# Patient Record
Sex: Female | Born: 1935
Health system: Southern US, Community
[De-identification: ages and names within clinical notes are randomized; demographics above are authoritative.]

## PROBLEM LIST (undated history)

## (undated) DIAGNOSIS — C801 Malignant (primary) neoplasm, unspecified: Secondary | ICD-10-CM

## (undated) DIAGNOSIS — Z9221 Personal history of antineoplastic chemotherapy: Secondary | ICD-10-CM

## (undated) DIAGNOSIS — I10 Essential (primary) hypertension: Secondary | ICD-10-CM

## (undated) DIAGNOSIS — Z86718 Personal history of other venous thrombosis and embolism: Secondary | ICD-10-CM

## (undated) HISTORY — PX: ABDOMINAL HYSTERECTOMY: SHX81

## (undated) HISTORY — PX: BREAST SURGERY: SHX581

## (undated) HISTORY — PX: EYE SURGERY: SHX253

## (undated) HISTORY — DX: Essential (primary) hypertension: I10

## (undated) HISTORY — DX: Malignant (primary) neoplasm, unspecified: C80.1

## (undated) HISTORY — PX: ROTATOR CUFF REPAIR: SHX139

## (undated) HISTORY — DX: Personal history of other venous thrombosis and embolism: Z86.718

## (undated) HISTORY — PX: OTHER SURGICAL HISTORY: SHX169

---

## 1997-01-16 DIAGNOSIS — Z9221 Personal history of antineoplastic chemotherapy: Secondary | ICD-10-CM

## 1997-01-16 HISTORY — DX: Personal history of antineoplastic chemotherapy: Z92.21

## 1997-01-16 HISTORY — PX: MASTECTOMY: SHX3

## 1997-05-08 HISTORY — PX: BREAST EXCISIONAL BIOPSY: SUR124

## 1997-12-22 ENCOUNTER — Other Ambulatory Visit: Admission: RE | Admit: 1997-12-22 | Discharge: 1997-12-22 | Payer: Self-pay | Admitting: Internal Medicine

## 1998-09-22 ENCOUNTER — Inpatient Hospital Stay (HOSPITAL_COMMUNITY): Admission: RE | Admit: 1998-09-22 | Discharge: 1998-09-29 | Payer: Self-pay | Admitting: Internal Medicine

## 1998-09-22 ENCOUNTER — Encounter: Payer: Self-pay | Admitting: Internal Medicine

## 1998-11-01 ENCOUNTER — Ambulatory Visit (HOSPITAL_COMMUNITY): Admission: RE | Admit: 1998-11-01 | Discharge: 1998-11-01 | Payer: Self-pay | Admitting: Oncology

## 1998-11-01 ENCOUNTER — Encounter (HOSPITAL_COMMUNITY): Payer: Self-pay | Admitting: Oncology

## 1998-11-08 ENCOUNTER — Encounter (HOSPITAL_COMMUNITY): Payer: Self-pay | Admitting: Oncology

## 1998-11-08 ENCOUNTER — Ambulatory Visit (HOSPITAL_COMMUNITY): Admission: RE | Admit: 1998-11-08 | Discharge: 1998-11-08 | Payer: Self-pay | Admitting: Oncology

## 1998-11-11 ENCOUNTER — Ambulatory Visit (HOSPITAL_COMMUNITY): Admission: RE | Admit: 1998-11-11 | Discharge: 1998-11-11 | Payer: Self-pay | Admitting: Oncology

## 1998-11-11 ENCOUNTER — Encounter (HOSPITAL_COMMUNITY): Payer: Self-pay | Admitting: Oncology

## 1998-12-01 ENCOUNTER — Encounter: Payer: Self-pay | Admitting: Thoracic Surgery

## 1998-12-02 ENCOUNTER — Encounter (INDEPENDENT_AMBULATORY_CARE_PROVIDER_SITE_OTHER): Payer: Self-pay | Admitting: Specialist

## 1998-12-02 ENCOUNTER — Encounter: Payer: Self-pay | Admitting: Thoracic Surgery

## 1998-12-02 ENCOUNTER — Inpatient Hospital Stay: Admission: RE | Admit: 1998-12-02 | Discharge: 1998-12-06 | Payer: Self-pay | Admitting: Thoracic Surgery

## 1998-12-03 ENCOUNTER — Encounter: Payer: Self-pay | Admitting: Thoracic Surgery

## 1998-12-04 ENCOUNTER — Encounter: Payer: Self-pay | Admitting: Thoracic Surgery

## 1998-12-05 ENCOUNTER — Encounter: Payer: Self-pay | Admitting: Thoracic Surgery

## 1998-12-06 ENCOUNTER — Encounter: Payer: Self-pay | Admitting: Thoracic Surgery

## 1998-12-15 ENCOUNTER — Encounter: Admission: RE | Admit: 1998-12-15 | Discharge: 1998-12-15 | Payer: Self-pay | Admitting: Thoracic Surgery

## 1998-12-15 ENCOUNTER — Encounter: Payer: Self-pay | Admitting: Thoracic Surgery

## 1999-01-05 ENCOUNTER — Encounter: Payer: Self-pay | Admitting: Thoracic Surgery

## 1999-01-05 ENCOUNTER — Encounter: Admission: RE | Admit: 1999-01-05 | Discharge: 1999-01-05 | Payer: Self-pay | Admitting: Thoracic Surgery

## 1999-02-04 ENCOUNTER — Encounter: Admission: RE | Admit: 1999-02-04 | Discharge: 1999-02-04 | Payer: Self-pay | Admitting: Internal Medicine

## 1999-02-04 ENCOUNTER — Encounter: Payer: Self-pay | Admitting: Internal Medicine

## 2000-01-17 HISTORY — PX: BREAST EXCISIONAL BIOPSY: SUR124

## 2000-02-07 ENCOUNTER — Encounter: Admission: RE | Admit: 2000-02-07 | Discharge: 2000-02-07 | Payer: Self-pay | Admitting: Internal Medicine

## 2000-02-07 ENCOUNTER — Encounter: Payer: Self-pay | Admitting: Internal Medicine

## 2000-05-28 ENCOUNTER — Ambulatory Visit (HOSPITAL_BASED_OUTPATIENT_CLINIC_OR_DEPARTMENT_OTHER): Admission: RE | Admit: 2000-05-28 | Discharge: 2000-05-28 | Payer: Self-pay | Admitting: General Surgery

## 2000-05-28 ENCOUNTER — Encounter (INDEPENDENT_AMBULATORY_CARE_PROVIDER_SITE_OTHER): Payer: Self-pay | Admitting: Specialist

## 2001-02-14 ENCOUNTER — Encounter: Payer: Self-pay | Admitting: Internal Medicine

## 2001-02-14 ENCOUNTER — Encounter: Admission: RE | Admit: 2001-02-14 | Discharge: 2001-02-14 | Payer: Self-pay | Admitting: Internal Medicine

## 2001-03-25 ENCOUNTER — Encounter (HOSPITAL_COMMUNITY): Payer: Self-pay | Admitting: Oncology

## 2001-03-25 ENCOUNTER — Ambulatory Visit (HOSPITAL_COMMUNITY): Admission: RE | Admit: 2001-03-25 | Discharge: 2001-03-25 | Payer: Self-pay | Admitting: Oncology

## 2002-02-17 ENCOUNTER — Encounter: Admission: RE | Admit: 2002-02-17 | Discharge: 2002-02-17 | Payer: Self-pay | Admitting: General Surgery

## 2002-02-17 ENCOUNTER — Encounter: Payer: Self-pay | Admitting: General Surgery

## 2002-03-25 ENCOUNTER — Ambulatory Visit (HOSPITAL_COMMUNITY): Admission: RE | Admit: 2002-03-25 | Discharge: 2002-03-25 | Payer: Self-pay | Admitting: Oncology

## 2002-03-25 ENCOUNTER — Encounter (HOSPITAL_COMMUNITY): Payer: Self-pay | Admitting: Oncology

## 2003-02-24 ENCOUNTER — Ambulatory Visit (HOSPITAL_COMMUNITY): Admission: RE | Admit: 2003-02-24 | Discharge: 2003-02-24 | Payer: Self-pay | Admitting: Internal Medicine

## 2004-02-16 ENCOUNTER — Ambulatory Visit (HOSPITAL_COMMUNITY): Admission: RE | Admit: 2004-02-16 | Discharge: 2004-02-16 | Payer: Self-pay | Admitting: Internal Medicine

## 2004-02-25 ENCOUNTER — Ambulatory Visit (HOSPITAL_COMMUNITY): Admission: RE | Admit: 2004-02-25 | Discharge: 2004-02-25 | Payer: Self-pay | Admitting: Internal Medicine

## 2004-05-02 ENCOUNTER — Ambulatory Visit (HOSPITAL_COMMUNITY): Admission: RE | Admit: 2004-05-02 | Discharge: 2004-05-02 | Payer: Self-pay | Admitting: *Deleted

## 2004-05-02 ENCOUNTER — Encounter (INDEPENDENT_AMBULATORY_CARE_PROVIDER_SITE_OTHER): Payer: Self-pay | Admitting: *Deleted

## 2005-04-06 ENCOUNTER — Ambulatory Visit (HOSPITAL_COMMUNITY): Admission: RE | Admit: 2005-04-06 | Discharge: 2005-04-06 | Payer: Self-pay | Admitting: Internal Medicine

## 2005-06-13 ENCOUNTER — Ambulatory Visit: Payer: Self-pay | Admitting: Internal Medicine

## 2005-06-16 ENCOUNTER — Ambulatory Visit (HOSPITAL_COMMUNITY): Admission: RE | Admit: 2005-06-16 | Discharge: 2005-06-16 | Payer: Self-pay | Admitting: Internal Medicine

## 2005-06-21 ENCOUNTER — Ambulatory Visit: Payer: Self-pay | Admitting: Internal Medicine

## 2005-07-11 ENCOUNTER — Ambulatory Visit: Payer: Self-pay | Admitting: Internal Medicine

## 2005-07-20 ENCOUNTER — Ambulatory Visit (HOSPITAL_COMMUNITY): Admission: RE | Admit: 2005-07-20 | Discharge: 2005-07-20 | Payer: Self-pay | Admitting: Internal Medicine

## 2005-07-25 ENCOUNTER — Ambulatory Visit: Payer: Self-pay | Admitting: Internal Medicine

## 2005-08-18 ENCOUNTER — Ambulatory Visit (HOSPITAL_COMMUNITY): Admission: RE | Admit: 2005-08-18 | Discharge: 2005-08-18 | Payer: Self-pay | Admitting: Internal Medicine

## 2006-04-12 ENCOUNTER — Ambulatory Visit (HOSPITAL_COMMUNITY): Admission: RE | Admit: 2006-04-12 | Discharge: 2006-04-12 | Payer: Self-pay | Admitting: Internal Medicine

## 2007-04-16 ENCOUNTER — Ambulatory Visit (HOSPITAL_COMMUNITY): Admission: RE | Admit: 2007-04-16 | Discharge: 2007-04-16 | Payer: Self-pay | Admitting: Internal Medicine

## 2008-04-21 ENCOUNTER — Ambulatory Visit (HOSPITAL_COMMUNITY): Admission: RE | Admit: 2008-04-21 | Discharge: 2008-04-21 | Payer: Self-pay | Admitting: Internal Medicine

## 2009-03-09 ENCOUNTER — Ambulatory Visit: Payer: Self-pay | Admitting: Surgery

## 2009-03-09 ENCOUNTER — Inpatient Hospital Stay (HOSPITAL_COMMUNITY): Admission: EM | Admit: 2009-03-09 | Discharge: 2009-03-11 | Payer: Self-pay | Admitting: Internal Medicine

## 2009-03-09 ENCOUNTER — Ambulatory Visit: Admission: RE | Admit: 2009-03-09 | Discharge: 2009-03-09 | Payer: Self-pay | Admitting: Internal Medicine

## 2009-03-09 ENCOUNTER — Encounter (INDEPENDENT_AMBULATORY_CARE_PROVIDER_SITE_OTHER): Payer: Self-pay | Admitting: Internal Medicine

## 2009-04-23 ENCOUNTER — Ambulatory Visit (HOSPITAL_COMMUNITY): Admission: RE | Admit: 2009-04-23 | Discharge: 2009-04-23 | Payer: Self-pay | Admitting: Internal Medicine

## 2010-02-22 ENCOUNTER — Other Ambulatory Visit: Payer: Self-pay | Admitting: Surgery

## 2010-03-21 ENCOUNTER — Other Ambulatory Visit (HOSPITAL_COMMUNITY): Payer: Self-pay | Admitting: Internal Medicine

## 2010-03-21 DIAGNOSIS — Z1231 Encounter for screening mammogram for malignant neoplasm of breast: Secondary | ICD-10-CM

## 2010-04-08 LAB — CBC
MCHC: 33.3 g/dL (ref 30.0–36.0)
Platelets: 192 10*3/uL (ref 150–400)
RBC: 4.6 MIL/uL (ref 3.87–5.11)
RDW: 13.7 % (ref 11.5–15.5)

## 2010-04-08 LAB — PROTIME-INR
INR: 1.04 (ref 0.00–1.49)
Prothrombin Time: 13.5 seconds (ref 11.6–15.2)
Prothrombin Time: 14.5 seconds (ref 11.6–15.2)

## 2010-04-08 LAB — LUPUS ANTICOAGULANT PANEL: dRVVT Incubated 1:1 Mix: 41.7 secs (ref 36.2–44.3)

## 2010-04-08 LAB — BETA-2-GLYCOPROTEIN I ABS, IGG/M/A
Beta-2 Glyco I IgG: 3 U/mL (ref <=15)
Beta-2-Glycoprotein I IgM: 12 U/mL (ref <=15)

## 2010-04-08 LAB — COMPREHENSIVE METABOLIC PANEL
ALT: 15 U/L (ref 0–35)
AST: 23 U/L (ref 0–37)
Albumin: 3.4 g/dL — ABNORMAL LOW (ref 3.5–5.2)
CO2: 28 mEq/L (ref 19–32)
Calcium: 8.9 mg/dL (ref 8.4–10.5)
GFR calc Af Amer: >60 mL/min (ref >60)
Sodium: 142 mEq/L (ref 135–145)
Total Protein: 6.8 g/dL (ref 6.0–8.3)

## 2010-04-08 LAB — CARDIOLIPIN ANTIBODIES, IGG, IGM, IGA: Anticardiolipin IgG: 3 GPL U/mL — ABNORMAL LOW (ref <10)

## 2010-04-08 LAB — APTT: aPTT: 30 seconds (ref 24–37)

## 2010-04-08 LAB — ANTITHROMBIN III: AntiThromb III Func: 112 % (ref 76–126)

## 2010-04-08 LAB — PROTHROMBIN GENE MUTATION

## 2010-05-02 ENCOUNTER — Other Ambulatory Visit (HOSPITAL_COMMUNITY): Payer: Self-pay | Admitting: Internal Medicine

## 2010-05-02 ENCOUNTER — Ambulatory Visit (HOSPITAL_COMMUNITY)
Admission: RE | Admit: 2010-05-02 | Discharge: 2010-05-02 | Disposition: A | Discharge status: Home / Self Care | Payer: Medicare Other | Source: Ambulatory Visit | Attending: Internal Medicine | Admitting: Internal Medicine

## 2010-05-02 DIAGNOSIS — Z1231 Encounter for screening mammogram for malignant neoplasm of breast: Secondary | ICD-10-CM

## 2010-06-03 NOTE — Op Note (Signed)
NAMENYEISHA, GOODALL               ACCOUNT NO.:  [id number]   MEDICAL RECORD NO.:  [id number]          PATIENT TYPE:  AMB   LOCATION:  ENDO                         FACILITY:  [facility]   PHYSICIAN:  Georgiana Spinner, M.D.    DATE OF BIRTH:  [birth year]   DATE OF PROCEDURE:  DATE OF DISCHARGE:                                 OPERATIVE REPORT   PROCEDURE:  Colonoscopy with biopsy.   INDICATIONS:  Colon polyps.   ANESTHESIA:  Demerol 50, Versed 5 mg.   PROCEDURE:  With the patient mildly sedated in the left lateral decubitus  position, the Olympus videoscopic colonoscope was inserted into the rectum  and passed under direct vision to the cecum identified by ileocecal valve  and appendiceal orifice, both of which were photographed. From this point,  the colonoscope was slowly withdrawn taking circumferential views of colonic  mucosa, pulling back through the tortuous diverticular filled sigmoid colon,  until we reached 20 cm from the anal verge, at which point a polyp was seen,  photographed, and removed using hot biopsy forceps technique, setting of  20/200 blended current. We next stopped in the rectum which appeared normal  on direct and showed hemorrhoids on retroflexed view. The endoscope was  straightened and withdrawn. The patient's vital signs and pulse oximetry  remained stable. The patient tolerated procedure well without apparent  complications.   FINDINGS:  1.  Diverticulosis of sigmoid colon.  2.  Internal hemorrhoids.  3.  Polyp at 20 cm from the anal verge.   PLAN:  1.  Await biopsy report.  2.  The patient will call me for results and follow-up with me as an      outpatient.      GMO/MEDQ  D:  05/02/2004  T:  05/02/2004  Job:  [id number]

## 2010-06-21 ENCOUNTER — Other Ambulatory Visit: Payer: Self-pay | Admitting: Registered Nurse

## 2011-03-28 ENCOUNTER — Other Ambulatory Visit (HOSPITAL_COMMUNITY): Payer: Self-pay | Admitting: Internal Medicine

## 2011-03-28 DIAGNOSIS — Z1231 Encounter for screening mammogram for malignant neoplasm of breast: Secondary | ICD-10-CM

## 2011-05-11 ENCOUNTER — Ambulatory Visit (HOSPITAL_COMMUNITY): Payer: Medicare Other

## 2011-05-22 ENCOUNTER — Ambulatory Visit (HOSPITAL_COMMUNITY)
Admission: RE | Admit: 2011-05-22 | Discharge: 2011-05-22 | Disposition: A | Discharge status: Home / Self Care | Payer: Medicare Other | Source: Ambulatory Visit | Attending: Internal Medicine | Admitting: Internal Medicine

## 2011-05-22 DIAGNOSIS — Z1231 Encounter for screening mammogram for malignant neoplasm of breast: Secondary | ICD-10-CM | POA: Insufficient documentation

## 2011-09-06 ENCOUNTER — Other Ambulatory Visit: Payer: Self-pay | Admitting: Dermatology

## 2011-10-17 ENCOUNTER — Other Ambulatory Visit: Payer: Self-pay | Admitting: Orthopedic Surgery

## 2011-10-17 DIAGNOSIS — M25511 Pain in right shoulder: Secondary | ICD-10-CM

## 2011-10-18 ENCOUNTER — Ambulatory Visit
Admission: RE | Admit: 2011-10-18 | Discharge: 2011-10-18 | Disposition: A | Discharge status: Home / Self Care | Payer: Medicare Other | Source: Ambulatory Visit | Attending: Orthopedic Surgery | Admitting: Orthopedic Surgery

## 2011-10-18 DIAGNOSIS — M25511 Pain in right shoulder: Secondary | ICD-10-CM

## 2012-03-11 ENCOUNTER — Other Ambulatory Visit: Payer: Self-pay | Admitting: Surgery

## 2012-04-22 ENCOUNTER — Other Ambulatory Visit (HOSPITAL_COMMUNITY): Payer: Self-pay | Admitting: Internal Medicine

## 2012-04-22 DIAGNOSIS — Z1231 Encounter for screening mammogram for malignant neoplasm of breast: Secondary | ICD-10-CM

## 2012-05-27 ENCOUNTER — Other Ambulatory Visit (HOSPITAL_COMMUNITY): Payer: Self-pay | Admitting: Internal Medicine

## 2012-05-27 ENCOUNTER — Ambulatory Visit (HOSPITAL_COMMUNITY)
Admission: RE | Admit: 2012-05-27 | Discharge: 2012-05-27 | Disposition: A | Discharge status: Home / Self Care | Payer: Medicare Other | Source: Ambulatory Visit | Attending: Internal Medicine | Admitting: Internal Medicine

## 2012-05-27 DIAGNOSIS — Z1231 Encounter for screening mammogram for malignant neoplasm of breast: Secondary | ICD-10-CM

## 2013-04-22 ENCOUNTER — Other Ambulatory Visit (HOSPITAL_COMMUNITY): Payer: Self-pay | Admitting: Internal Medicine

## 2013-04-22 DIAGNOSIS — Z1231 Encounter for screening mammogram for malignant neoplasm of breast: Secondary | ICD-10-CM

## 2013-06-02 ENCOUNTER — Ambulatory Visit (HOSPITAL_COMMUNITY)
Admission: RE | Admit: 2013-06-02 | Discharge: 2013-06-02 | Disposition: A | Discharge status: Home / Self Care | Payer: Medicare Other | Source: Ambulatory Visit | Attending: Internal Medicine | Admitting: Internal Medicine

## 2013-06-02 DIAGNOSIS — Z1231 Encounter for screening mammogram for malignant neoplasm of breast: Secondary | ICD-10-CM | POA: Insufficient documentation

## 2013-06-13 ENCOUNTER — Ambulatory Visit: Payer: Self-pay | Admitting: Podiatrist

## 2013-06-18 ENCOUNTER — Encounter: Payer: Self-pay | Admitting: Podiatry

## 2013-06-18 ENCOUNTER — Ambulatory Visit (INDEPENDENT_AMBULATORY_CARE_PROVIDER_SITE_OTHER): Payer: Medicare Other | Admitting: Podiatry

## 2013-06-18 ENCOUNTER — Ambulatory Visit (INDEPENDENT_AMBULATORY_CARE_PROVIDER_SITE_OTHER): Payer: Medicare Other

## 2013-06-18 VITALS — BP 122/83 | HR 90 | Resp 18 | Ht 63.5 in | Wt 150.0 lb

## 2013-06-18 DIAGNOSIS — M722 Plantar fascial fibromatosis: Secondary | ICD-10-CM

## 2013-06-18 DIAGNOSIS — M79609 Pain in unspecified limb: Secondary | ICD-10-CM

## 2013-06-18 DIAGNOSIS — B351 Tinea unguium: Secondary | ICD-10-CM

## 2013-06-18 MED ORDER — TRIAMCINOLONE ACETONIDE 10 MG/ML IJ SUSP
10.0000 mg | Freq: Once | INTRAMUSCULAR | Status: AC
  Administered 2013-06-18: 10 mg

## 2013-06-18 NOTE — Progress Notes (Signed)
Subjective:     Patient ID: Stephanie Pearson, female   DOB: 1927-07-14, 80 y.o.   MRN: [id number]  HPI patient states that both of her heels are bothering her but the right is much worse and that her nails are thick and she needs to get and can't as of the right time   Review of Systems     Objective:   Physical Exam Neurovascular status unchanged with range of motion adequate and exquisite discomfort plantar aspect heel right over left with inflammation noted in nail disease with thickness yellow discoloration and brittle appearance 1-5 both feet with pain    Assessment:     Plantar fasciitis he'll right over left and mycotic nail infection of both feet with pain    Plan:     Reviewed x-rays and H&P and injected the right plantar fascia 3 mg Kenalog 5 mm site Marcaine mixture and debridement nailbeds 1-5 both feet with no iatrogenic bleeding noted

## 2014-03-22 ENCOUNTER — Encounter (HOSPITAL_COMMUNITY): Payer: Self-pay

## 2014-03-22 ENCOUNTER — Ambulatory Visit (HOSPITAL_COMMUNITY)
Admission: RE | Admit: 2014-03-22 | Discharge: 2014-03-22 | Disposition: A | Discharge status: Home / Self Care | Payer: PPO | Source: Ambulatory Visit | Attending: Family Medicine | Admitting: Family Medicine

## 2014-03-22 ENCOUNTER — Ambulatory Visit (INDEPENDENT_AMBULATORY_CARE_PROVIDER_SITE_OTHER): Payer: PPO | Admitting: Family Medicine

## 2014-03-22 ENCOUNTER — Ambulatory Visit (INDEPENDENT_AMBULATORY_CARE_PROVIDER_SITE_OTHER): Payer: PPO

## 2014-03-22 VITALS — BP 168/76 | HR 66 | Temp 97.6°F | Resp 20 | Ht 63.5 in | Wt 149.2 lb

## 2014-03-22 DIAGNOSIS — M25551 Pain in right hip: Secondary | ICD-10-CM | POA: Diagnosis not present

## 2014-03-22 DIAGNOSIS — R1030 Lower abdominal pain, unspecified: Secondary | ICD-10-CM | POA: Insufficient documentation

## 2014-03-22 DIAGNOSIS — S32501A Unspecified fracture of right pubis, initial encounter for closed fracture: Secondary | ICD-10-CM | POA: Diagnosis not present

## 2014-03-22 DIAGNOSIS — S32591A Other specified fracture of right pubis, initial encounter for closed fracture: Secondary | ICD-10-CM

## 2014-03-22 MED ORDER — TRAMADOL HCL 50 MG PO TABS: 50.0000 mg | ORAL_TABLET | Freq: Three times a day (TID) | ORAL | Status: DC | PRN

## 2014-03-22 NOTE — Progress Notes (Addendum)
° °  Subjective:    Patient ID: Stephanie Pearson, female    DOB: 02/01/1927, 81 y.o.   MRN: [id number] This chart was scribed for Robyn Haber, MD by Zola Button, Medical Scribe. This patient was seen in Room 1 and the patient's care was started at 12:13 PM.   HPI HPI Comments: Stephanie Pearson is a 81 y.o. female who presents to the [facility] complaining of sudden onset right groin pain secondary to a fall that occurred 2 days ago. She states her right leg went back as she fell. The pain is worse with weight bearing.    Review of Systems  Genitourinary: Positive for pelvic pain.       Objective:   Physical Exam CONSTITUTIONAL: Well developed/well nourished HEAD: Normocephalic/atraumatic EYES: EOM/PERRL ENMT: Mucous membranes moist NECK: supple no meningeal signs SPINE: entire spine nontender CV: S1/S2 noted, no murmurs/rubs/gallops noted LUNGS: Lungs are clear to auscultation bilaterally, no apparent distress ABDOMEN: soft, nontender, no rebound or guarding GU: no cva tenderness NEURO: Pt is awake/alert, moves all extremitiesx4 EXTREMITIES: pulses normal, full ROM although patient does have some pain with internal rotation of the right hip and she is tender in the right groin SKIN: warm, color normal PSYCH: no abnormalities of mood noted  [facility] reading (PRIMARY) by  Dr. Joseph Art:  Right hip does not show acute fracture.      Assessment & Plan:  Patient has history that that is very consistent with hip fracture. I don't see fracture on plain films but I'm very suspicious because of her exam.  We'll go ahead with a CT scan of the right hip today.  Addendum: CT scan shows bilateral pubic rami fractures.  This chart was scribed in my presence and reviewed by me personally.    ICD-9-CM ICD-10-CM   1. Hip pain, right 719.45 M25.551 DG HIP UNILAT WITH PELVIS 2-3 VIEWS RIGHT     CT Hip Right Wo Contrast     traMADol (ULTRAM) 50 MG tablet     CANCELED: CT  Hip Right W Contrast  2. Pubic ramus fracture, right, closed, initial encounter 808.2 S32.501A traMADol (ULTRAM) 50 MG tablet     Signed, Robyn Haber, MD   Signed, Carola Frost.D.

## 2014-05-08 ENCOUNTER — Other Ambulatory Visit (HOSPITAL_COMMUNITY): Payer: Self-pay | Admitting: Internal Medicine

## 2014-05-08 DIAGNOSIS — Z1231 Encounter for screening mammogram for malignant neoplasm of breast: Secondary | ICD-10-CM

## 2014-06-05 ENCOUNTER — Ambulatory Visit (HOSPITAL_COMMUNITY): Payer: PPO

## 2014-06-09 ENCOUNTER — Ambulatory Visit (HOSPITAL_COMMUNITY)
Admission: RE | Admit: 2014-06-09 | Discharge: 2014-06-09 | Disposition: A | Discharge status: Home / Self Care | Payer: PPO | Source: Ambulatory Visit | Attending: Internal Medicine | Admitting: Internal Medicine

## 2014-06-09 ENCOUNTER — Other Ambulatory Visit (HOSPITAL_COMMUNITY): Payer: Self-pay | Admitting: Internal Medicine

## 2014-06-09 DIAGNOSIS — Z1231 Encounter for screening mammogram for malignant neoplasm of breast: Secondary | ICD-10-CM

## 2015-02-09 DIAGNOSIS — Z86718 Personal history of other venous thrombosis and embolism: Secondary | ICD-10-CM | POA: Diagnosis not present

## 2015-02-11 DIAGNOSIS — Z86718 Personal history of other venous thrombosis and embolism: Secondary | ICD-10-CM | POA: Diagnosis not present

## 2015-02-11 DIAGNOSIS — I10 Essential (primary) hypertension: Secondary | ICD-10-CM | POA: Diagnosis not present

## 2015-02-19 DIAGNOSIS — H6690 Otitis media, unspecified, unspecified ear: Secondary | ICD-10-CM | POA: Diagnosis not present

## 2015-03-05 DIAGNOSIS — H6123 Impacted cerumen, bilateral: Secondary | ICD-10-CM | POA: Diagnosis not present

## 2015-03-05 DIAGNOSIS — H903 Sensorineural hearing loss, bilateral: Secondary | ICD-10-CM | POA: Diagnosis not present

## 2015-03-28 ENCOUNTER — Ambulatory Visit (INDEPENDENT_AMBULATORY_CARE_PROVIDER_SITE_OTHER): Payer: PPO | Admitting: Family Medicine

## 2015-03-28 VITALS — BP 122/74 | HR 67 | Temp 97.6°F | Resp 17 | Ht 60.5 in | Wt 156.0 lb

## 2015-03-28 DIAGNOSIS — J189 Pneumonia, unspecified organism: Secondary | ICD-10-CM

## 2015-03-28 DIAGNOSIS — R062 Wheezing: Secondary | ICD-10-CM | POA: Diagnosis not present

## 2015-03-28 MED ORDER — AZITHROMYCIN 500 MG PO TABS: 500.0000 mg | ORAL_TABLET | Freq: Every day | ORAL | Status: DC

## 2015-03-28 MED ORDER — LEVALBUTEROL HCL 1.25 MG/3ML IN NEBU
1.2500 mg | INHALATION_SOLUTION | Freq: Once | RESPIRATORY_TRACT | Status: AC
  Administered 2015-03-28: 1.25 mg via RESPIRATORY_TRACT

## 2015-03-28 MED ORDER — DM-GUAIFENESIN ER 30-600 MG PO TB12: 1.0000 | ORAL_TABLET | Freq: Two times a day (BID) | ORAL | Status: DC

## 2015-03-28 MED ORDER — BENZONATATE 100 MG PO CAPS: 100.0000 mg | ORAL_CAPSULE | Freq: Three times a day (TID) | ORAL | Status: DC | PRN

## 2015-03-28 NOTE — Progress Notes (Signed)
Subjective:    Patient ID: Stephanie Pearson, female    DOB: 10-22-1927, 82 y.o.   MRN: [id number] By signing my name below, I, [name], attest that this documentation has been prepared under the direction and in the presence of Delman Cheadle, MD. Electronically Signed: [name], ER Scribe. 03/28/2015. 8:47 AM.  Chief Complaint  Patient presents with  . Cough  . URI    HPI HPI Comments: Stephanie Pearson is a 82 y.o. female who presents to [facility] complaining of rhinorrhea and non productive cough for the last three days. She is not having sinus pressure. She is SOB all the time, and that is not increased from baseline. She denies fever chills. She did not have a flu shot. Dr. Jani Gravel is her PCP at [facility]. She denies a hx of atrial fibrillation. She has been using an incentive spirometer over the last 24 hours.   Past Medical History  Diagnosis Date  . Hypertension   . History of blood clots   . Cancer ([place])    No Known Allergies  Current Outpatient Prescriptions on File Prior to Visit  Medication Sig Dispense Refill  . amLODipine (NORVASC) 2.5 MG tablet     . fosinopril (MONOPRIL) 40 MG tablet     . lovastatin (MEVACOR) 40 MG tablet     . metoprolol (LOPRESSOR) 50 MG tablet     . pantoprazole (PROTONIX) 40 MG tablet     . traMADol (ULTRAM) 50 MG tablet Take 1 tablet (50 mg total) by mouth every 8 (eight) hours as needed. 50 tablet 0  . XARELTO 20 MG TABS tablet     . zolpidem (AMBIEN) 10 MG tablet      No current facility-administered medications on file prior to visit.    Review of Systems  Constitutional: Positive for activity change, appetite change and fatigue. Negative for fever, chills and diaphoresis.  HENT: Positive for congestion, postnasal drip, rhinorrhea, sinus pressure, sore throat, trouble swallowing and voice change. Negative for hearing loss.   Respiratory: Positive for cough, chest tightness and shortness of breath. Negative  for wheezing.   Cardiovascular: Negative for chest pain and palpitations.  Allergic/Immunologic: Negative for immunocompromised state.  Neurological: Negative for dizziness and light-headedness.  Hematological: Negative for adenopathy.  Psychiatric/Behavioral: Positive for sleep disturbance.       Objective:  BP 122/74 mmHg  Pulse 67  Temp(Src) 97.6 F (36.4 C) (Oral)  Resp 17  Ht 5' 0.5" (1.537 m)  Wt 156 lb (70.761 kg)  BMI 29.95 kg/m2  SpO2 96%  Physical Exam  Constitutional: She is oriented to person, place, and time. She appears well-developed and well-nourished. No distress.  HENT:  Head: Normocephalic and atraumatic.  Mouth/Throat: Oropharynx is clear and moist. No oropharyngeal exudate.  Bilateral TMs normal. Minimal rhinitis bilaterally. Oropharynx normal.   Eyes: Pupils are equal, round, and reactive to light.  Neck: Neck supple.  Tonsillar adenopathy.   Cardiovascular: Normal rate and regular rhythm.   Murmur heard. 2/6 systolic injection murmer.   Pulmonary/Chest: Effort normal. No respiratory distress.  Expiratory wheezing throughout.   Musculoskeletal: Normal range of motion.  Neurological: She is alert and oriented to person, place, and time. Coordination normal.  Skin: Skin is warm and dry. She is not diaphoretic.  Psychiatric: She has a normal mood and affect. Her behavior is normal.  Nursing note and vitals reviewed.  On recheck after xopenex neb, improved air movement but still with end  expiratory rhonchi.      Assessment & Plan:   1. Wheeze - minimal improvement after xopenex so cont using incentive spirometer sev times a day.  2. Walking pneumonia vs bronchitis -  Could be bronchitis vs atypical pna so cover with azithro at pna dosing.  If not starting to improve in 2-3d, recheck in office to cons needs for cbc and/or imaging.     Meds ordered this encounter  Medications  . levalbuterol (XOPENEX) nebulizer solution 1.25 mg    Sig:   .  DISCONTD: azithromycin (ZITHROMAX) 500 MG tablet    Sig: Take 1 tablet (500 mg total) by mouth daily.    Dispense:  3 tablet    Refill:  0  . DISCONTD: benzonatate (TESSALON) 100 MG capsule    Sig: Take 1-2 capsules (100-200 mg total) by mouth 3 (three) times daily as needed for cough.    Dispense:  40 capsule    Refill:  0  . DISCONTD: dextromethorphan-guaiFENesin (MUCINEX DM) 30-600 MG 12hr tablet    Sig: Take 1 tablet by mouth 2 (two) times daily.    Dispense:  14 tablet    Refill:  1  . azithromycin (ZITHROMAX) 500 MG tablet    Sig: Take 1 tablet (500 mg total) by mouth daily.    Dispense:  3 tablet    Refill:  0  . benzonatate (TESSALON) 100 MG capsule    Sig: Take 1-2 capsules (100-200 mg total) by mouth 3 (three) times daily as needed for cough.    Dispense:  40 capsule    Refill:  0  . dextromethorphan-guaiFENesin (MUCINEX DM) 30-600 MG 12hr tablet    Sig: Take 1 tablet by mouth 2 (two) times daily.    Dispense:  14 tablet    Refill:  1    I personally performed the services described in this documentation, which was scribed in my presence. The recorded information has been reviewed and considered, and addended by me as needed.  Delman Cheadle, MD MPH

## 2015-03-28 NOTE — Patient Instructions (Signed)

## 2015-03-30 ENCOUNTER — Ambulatory Visit (INDEPENDENT_AMBULATORY_CARE_PROVIDER_SITE_OTHER): Payer: PPO | Admitting: Emergency Medicine

## 2015-03-30 VITALS — BP 164/74 | HR 54 | Temp 98.0°F | Resp 20 | Ht 60.5 in | Wt 153.8 lb

## 2015-03-30 DIAGNOSIS — R001 Bradycardia, unspecified: Secondary | ICD-10-CM

## 2015-03-30 DIAGNOSIS — R062 Wheezing: Secondary | ICD-10-CM

## 2015-03-30 DIAGNOSIS — R059 Cough, unspecified: Secondary | ICD-10-CM

## 2015-03-30 DIAGNOSIS — R05 Cough: Secondary | ICD-10-CM | POA: Diagnosis not present

## 2015-03-30 DIAGNOSIS — R9431 Abnormal electrocardiogram [ECG] [EKG]: Secondary | ICD-10-CM | POA: Diagnosis not present

## 2015-03-30 NOTE — Progress Notes (Signed)
Patient ID: Stephanie Pearson, female   DOB: [birth year], 82 y.o.   MRN: [id number]    By signing my name below, I, Stephanie Pearson, attest that this documentation has been prepared under the direction and in the presence of Stephanie Russian, MD Electronically Signed: Ladene Artist, ED Scribe 03/30/2015 at 9:07 AM.  Chief Complaint:  Chief Complaint  Patient presents with  . Follow-up    for cough - feel better   HPI: Stephanie Pearson is a 82 y.o. female, with a h/o HTN, who reports to [facility] today for a follow-up regarding cough. Pt was seen in the office 2 days ago for rhinorrhea and dry cough; discharged with Zithromax, Mucinex DM and Tessalon. She states that she is feeling well overall, but has noticed dizziness that she attributes to Mucinex DM. She further reports that cough has resolved. She denies light-headedness. Pt finished her antibiotics; took her last dose this morning.   Past Medical History  Diagnosis Date  . Hypertension   . History of blood clots   . Cancer [facility])    Past Surgical History  Procedure Laterality Date  . Breast surgery    . Eye surgery    . Spot removed from lung    . Abdominal hysterectomy    . Rotator cuff repair     Social History   Social History  . Marital Status: Widowed    Spouse Name: N/A  . Number of Children: N/A  . Years of Education: N/A   Social History Main Topics  . Smoking status: Never Smoker   . Smokeless tobacco: Never Used  . Alcohol Use: No  . Drug Use: No  . Sexual Activity: Not Asked   Other Topics Concern  . None   Social History Narrative   No family history on file. Allergies  Allergen Reactions  . Prilosec [Omeprazole] Rash   Prior to Admission medications   Medication Sig Start Date End Date Taking? Authorizing Provider  amLODipine (NORVASC) 2.5 MG tablet  05/09/13  Yes Historical Provider, MD  azithromycin (ZITHROMAX) 500 MG tablet Take 1 tablet (500 mg total) by mouth daily. 03/28/15  Yes Shawnee Knapp, MD    benzonatate (TESSALON) 100 MG capsule Take 1-2 capsules (100-200 mg total) by mouth 3 (three) times daily as needed for cough. 03/28/15  Yes Shawnee Knapp, MD  dextromethorphan-guaiFENesin [facility] DM) 30-600 MG 12hr tablet Take 1 tablet by mouth 2 (two) times daily. 03/28/15  Yes Shawnee Knapp, MD  fosinopril (MONOPRIL) 40 MG tablet  06/16/13  Yes Historical Provider, MD  lovastatin (MEVACOR) 40 MG tablet  05/12/13  Yes Historical Provider, MD  metoprolol (LOPRESSOR) 50 MG tablet  05/30/13  Yes Historical Provider, MD  pantoprazole (PROTONIX) 40 MG tablet  04/07/13  Yes Historical Provider, MD  XARELTO 20 MG TABS tablet  05/20/13  Yes Historical Provider, MD  zolpidem (AMBIEN) 10 MG tablet  05/09/13  Yes Historical Provider, MD  traMADol (ULTRAM) 50 MG tablet Take 1 tablet (50 mg total) by mouth every 8 (eight) hours as needed. Patient not taking: Reported on 03/30/2015 03/22/14   Stephanie Haber, MD   ROS: The patient denies fevers, chills, night sweats, unintentional weight loss, -light-headedness, chest pain, palpitations, wheezing, dyspnea on exertion, nausea, vomiting, abdominal pain, dysuria, hematuria, melena, numbness, weakness, or tingling. +dizziness   All other systems have been reviewed and were otherwise negative with the exception of those mentioned in the HPI and as above.    PHYSICAL  EXAMDanley Danker Vitals:   03/30/15 0838  BP: 164/74  Pulse: 54  Temp: 98 F (36.7 C)  Resp: 20   Body mass index is 29.53 kg/(m^2).  General: Alert, no acute distress HEENT:  Normocephalic, atraumatic, oropharynx patent. Eye: Juliette Mangle [facility] Cardiovascular: Regular rate and rhythm, no rubs murmurs or gallops. No Carotid bruits, radial pulse intact. No pedal edema.  Respiratory: Clear to auscultation bilaterally. No wheezes, rales, or rhonchi. No cyanosis, no use of accessory musculature Abdominal: No organomegaly, abdomen is soft and non-tender, positive bowel sounds. No masses. Musculoskeletal: Gait intact. No  edema, tenderness Skin: No rashes. Neurologic: Facial musculature symmetric. Psychiatric: Patient acts appropriately throughout our interaction. Lymphatic: No cervical or submandibular lymphadenopathy  LABS:  EKG/XRAY:   Primary read interpreted by [facility]. Everlene Pearson at [facility]. Patient has a sinus bradycardia with ST segment depression in V2.  ASSESSMENT/PLAN: Patient states she has had a cardiac evaluation in the past. I did not have an old EKG to compare to. I advised her to make an appointment to see Dr. Ninfa Pearson so he can follow-up on her EKG. Clinically she is better with her respiratory illness. Her lungs were clear today. She has had some dizziness I suspect is medication related. She will stop all her medications. She has completed her course of Zithromax.I personally performed the services described in this documentation, which was scribed in my presence. The recorded information has been reviewed and is accurate.    Gross sideeffects, risk and benefits, and alternatives of medications d/w patient. Patient is aware that all medications have potential sideeffects and we are unable to predict every sideeffect or drug-drug interaction that may occur.  Arlyss Queen MD 03/30/2015 8:57 AM

## 2015-05-10 ENCOUNTER — Other Ambulatory Visit: Payer: Self-pay | Admitting: Internal Medicine

## 2015-05-10 ENCOUNTER — Other Ambulatory Visit: Payer: Self-pay

## 2015-05-10 DIAGNOSIS — Z9012 Acquired absence of left breast and nipple: Secondary | ICD-10-CM

## 2015-05-10 DIAGNOSIS — Z1231 Encounter for screening mammogram for malignant neoplasm of breast: Secondary | ICD-10-CM

## 2015-05-10 DIAGNOSIS — Z853 Personal history of malignant neoplasm of breast: Secondary | ICD-10-CM

## 2015-05-17 DIAGNOSIS — M1711 Unilateral primary osteoarthritis, right knee: Secondary | ICD-10-CM | POA: Diagnosis not present

## 2015-05-17 DIAGNOSIS — M25561 Pain in right knee: Secondary | ICD-10-CM | POA: Diagnosis not present

## 2015-05-17 DIAGNOSIS — M25461 Effusion, right knee: Secondary | ICD-10-CM | POA: Diagnosis not present

## 2015-06-01 DIAGNOSIS — I10 Essential (primary) hypertension: Secondary | ICD-10-CM | POA: Diagnosis not present

## 2015-06-01 DIAGNOSIS — N390 Urinary tract infection, site not specified: Secondary | ICD-10-CM | POA: Diagnosis not present

## 2015-06-01 DIAGNOSIS — M8580 Other specified disorders of bone density and structure, unspecified site: Secondary | ICD-10-CM | POA: Diagnosis not present

## 2015-06-01 DIAGNOSIS — E559 Vitamin D deficiency, unspecified: Secondary | ICD-10-CM | POA: Diagnosis not present

## 2015-06-07 DIAGNOSIS — M8580 Other specified disorders of bone density and structure, unspecified site: Secondary | ICD-10-CM | POA: Diagnosis not present

## 2015-06-07 DIAGNOSIS — I10 Essential (primary) hypertension: Secondary | ICD-10-CM | POA: Diagnosis not present

## 2015-06-07 DIAGNOSIS — E7800 Pure hypercholesterolemia, unspecified: Secondary | ICD-10-CM | POA: Diagnosis not present

## 2015-06-10 ENCOUNTER — Ambulatory Visit: Payer: PPO

## 2015-06-17 ENCOUNTER — Ambulatory Visit
Admission: RE | Admit: 2015-06-17 | Discharge: 2015-06-17 | Disposition: A | Discharge status: Home / Self Care | Payer: PPO | Source: Ambulatory Visit

## 2015-06-17 ENCOUNTER — Ambulatory Visit: Payer: PPO

## 2015-06-17 DIAGNOSIS — Z1231 Encounter for screening mammogram for malignant neoplasm of breast: Secondary | ICD-10-CM | POA: Diagnosis not present

## 2015-06-17 DIAGNOSIS — Z853 Personal history of malignant neoplasm of breast: Secondary | ICD-10-CM

## 2015-06-17 DIAGNOSIS — Z9012 Acquired absence of left breast and nipple: Secondary | ICD-10-CM

## 2015-07-07 DIAGNOSIS — H04123 Dry eye syndrome of bilateral lacrimal glands: Secondary | ICD-10-CM | POA: Diagnosis not present

## 2015-07-07 DIAGNOSIS — H52223 Regular astigmatism, bilateral: Secondary | ICD-10-CM | POA: Diagnosis not present

## 2015-07-07 DIAGNOSIS — H5201 Hypermetropia, right eye: Secondary | ICD-10-CM | POA: Diagnosis not present

## 2015-07-12 DIAGNOSIS — N390 Urinary tract infection, site not specified: Secondary | ICD-10-CM | POA: Diagnosis not present

## 2015-07-12 DIAGNOSIS — R300 Dysuria: Secondary | ICD-10-CM | POA: Diagnosis not present

## 2015-08-09 DIAGNOSIS — Z86718 Personal history of other venous thrombosis and embolism: Secondary | ICD-10-CM | POA: Diagnosis not present

## 2015-08-12 DIAGNOSIS — Z86718 Personal history of other venous thrombosis and embolism: Secondary | ICD-10-CM | POA: Diagnosis not present

## 2015-08-12 DIAGNOSIS — E7800 Pure hypercholesterolemia, unspecified: Secondary | ICD-10-CM | POA: Diagnosis not present

## 2015-08-12 DIAGNOSIS — I10 Essential (primary) hypertension: Secondary | ICD-10-CM | POA: Diagnosis not present

## 2015-09-26 IMAGING — CT CT HIP*R* W/O CM
3 of 7 series · 14 of 46 positions shown, 19 images · non-contrast
Comparison: Radiographs today.  Pelvic CT 02/16/2004.

CLINICAL DATA: Sudden onset of right groin pain today. Patient fell
2 days ago. Worsening pain with weight-bearing. Initial encounter.

EXAM:
CT OF THE RIGHT HIP WITHOUT CONTRAST
TECHNIQUE: Multidetector CT imaging of the right hip was performed according to
the standard protocol. Multiplanar CT image reconstructions were
also generated.

[Series 4: rt hip st · axial · 0.58mm/px · z∈[-241,-121]mm · 3 of 49 slices shown, 7 images]
[im 13/49  soft-tissue]
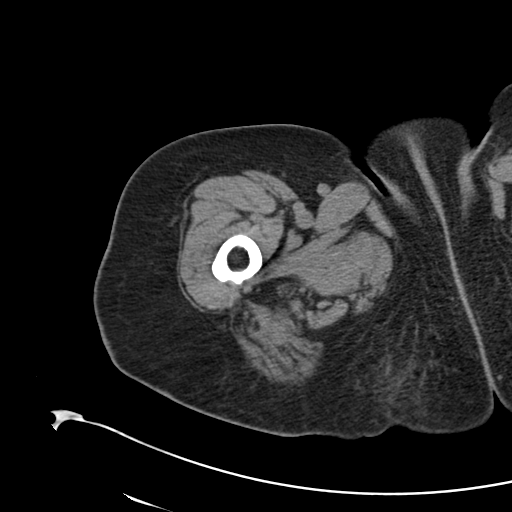
[im 13/49  lung]
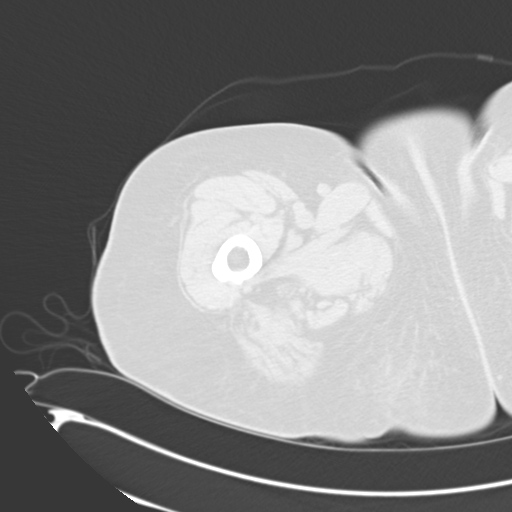
[im 13/49  bone]
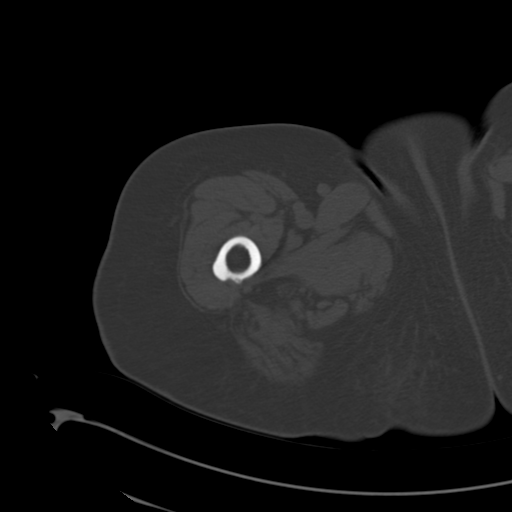
[im 25/49  soft-tissue]
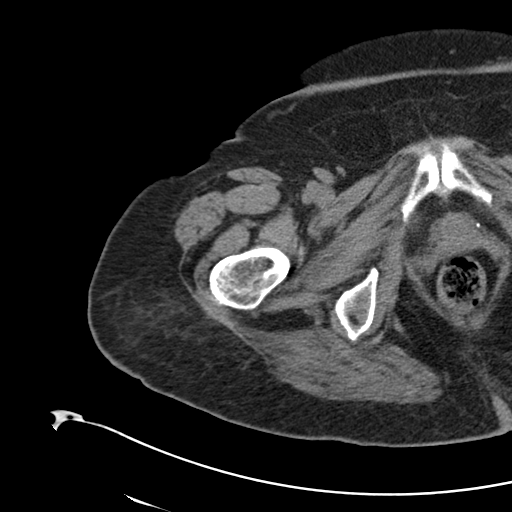
[im 25/49  lung]
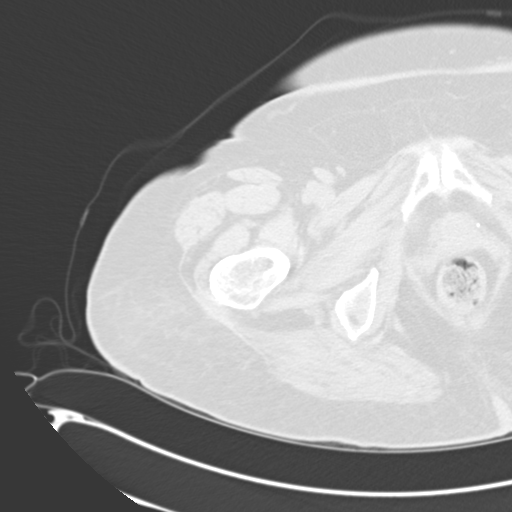
[im 37/49  soft-tissue]
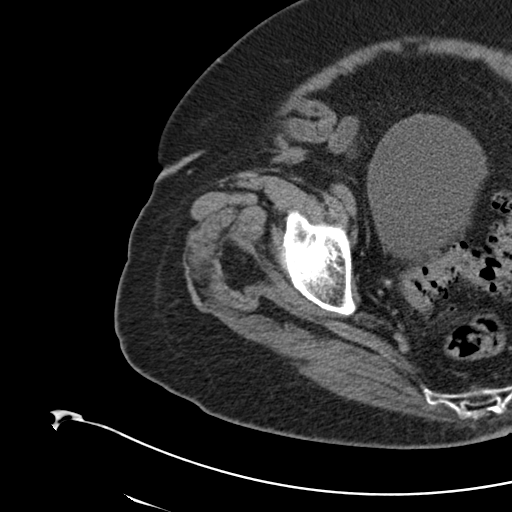
[im 37/49  lung]
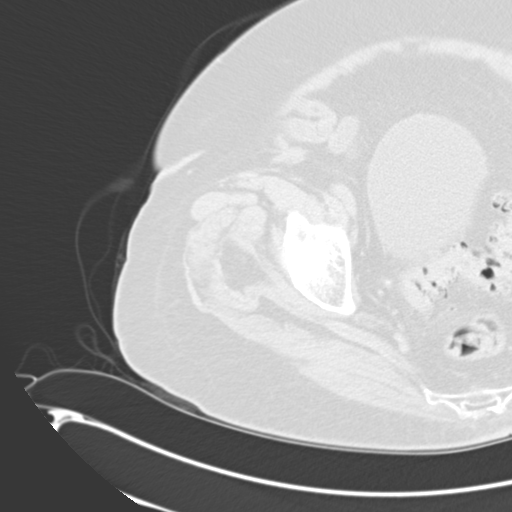

[Series 604: <mpr thick range(2)> · axial · 0.58mm/px · z∈[-351,-168]mm · 8 of 132 slices shown]
[im 11/132  soft-tissue]
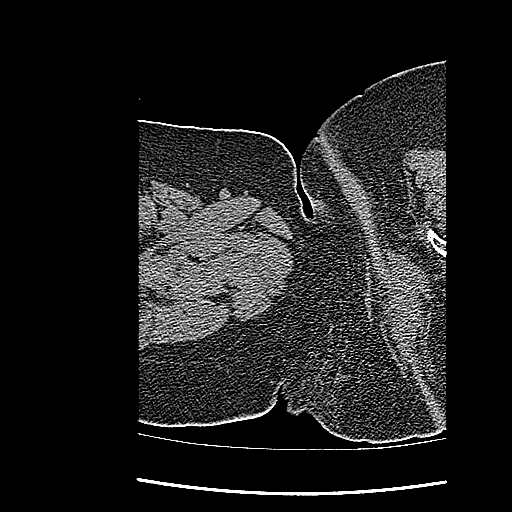
[im 31/132  soft-tissue]
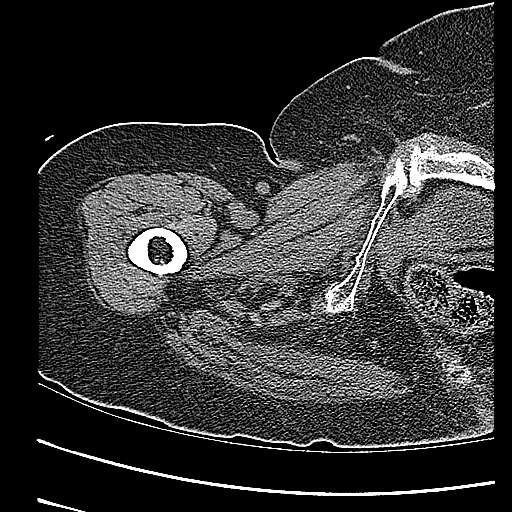
[im 41/132  soft-tissue]
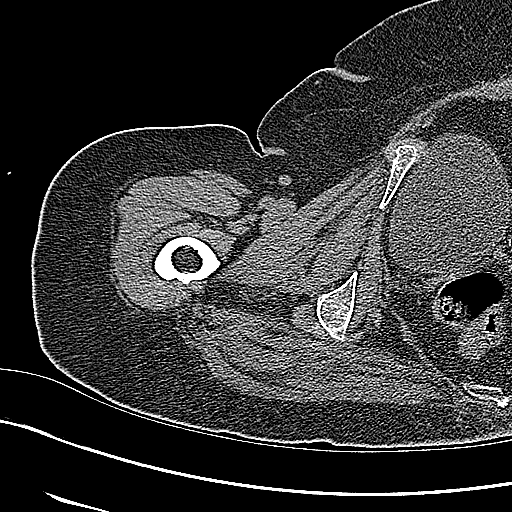
[im 61/132  soft-tissue]
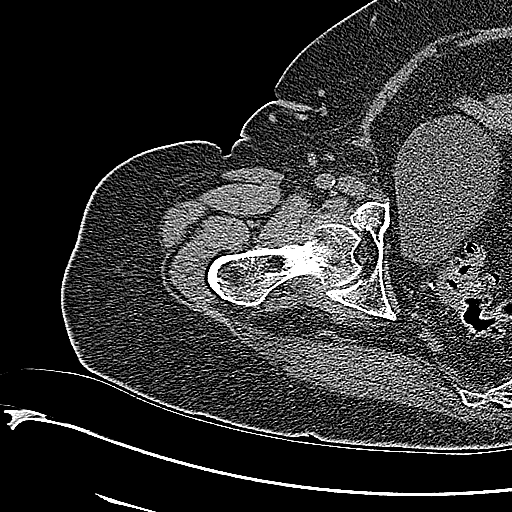
[im 71/132  soft-tissue]
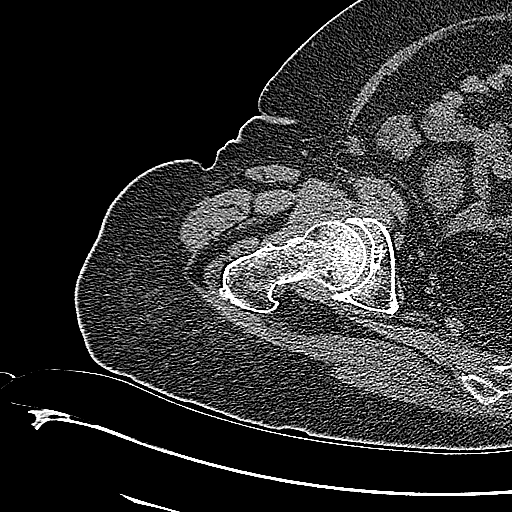
[im 91/132  soft-tissue]
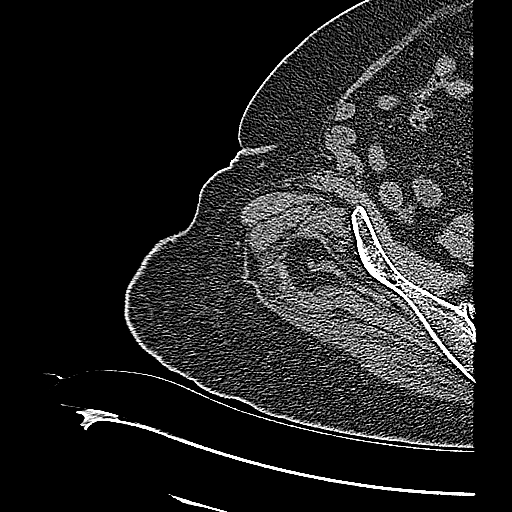
[im 101/132  soft-tissue]
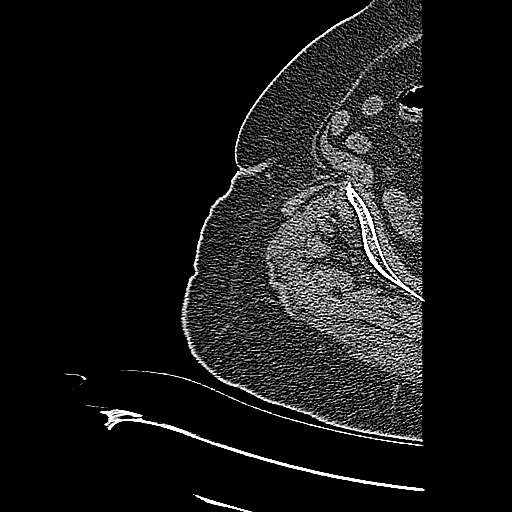
[im 121/132  soft-tissue]
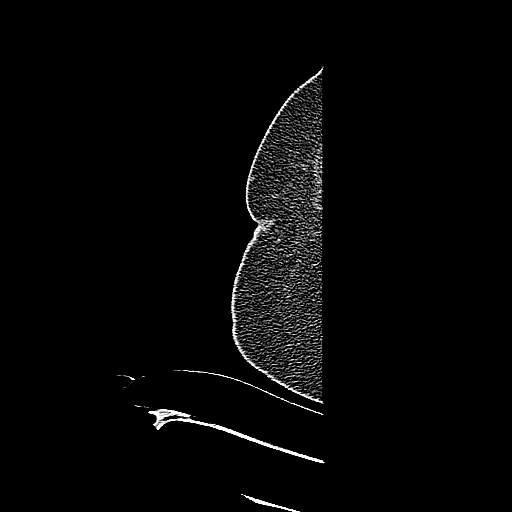

[Series 606: <mpr thick range(4)> · coronal · 0.58mm/px · 3 of 97 slices shown, 4 images]
[im 20/97  soft-tissue]
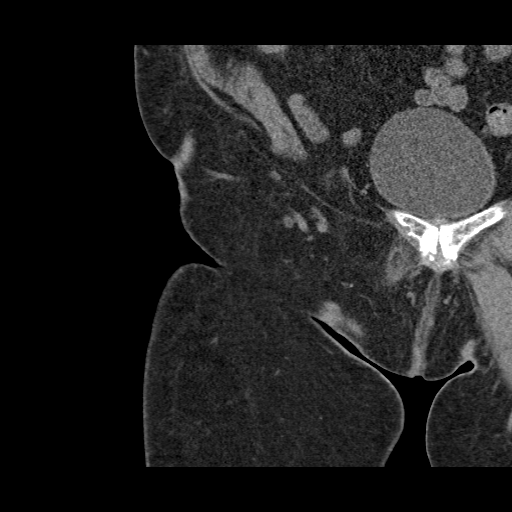
[im 39/97  soft-tissue]
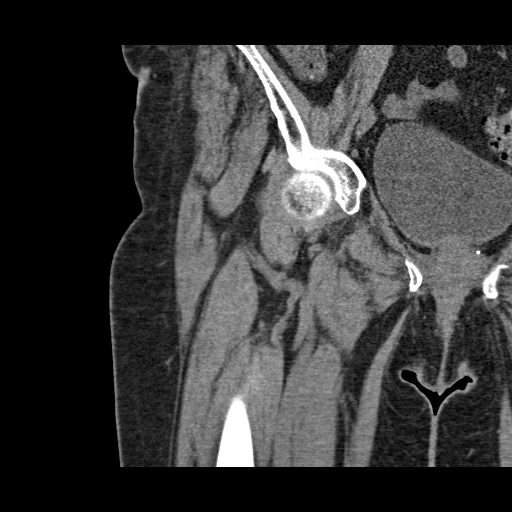
[im 39/97  bone]
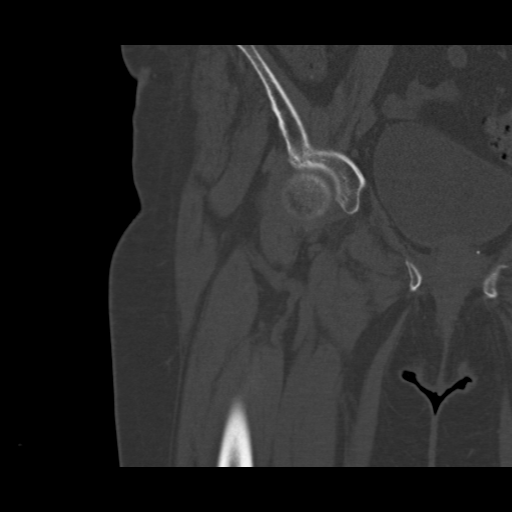
[im 58/97  soft-tissue]
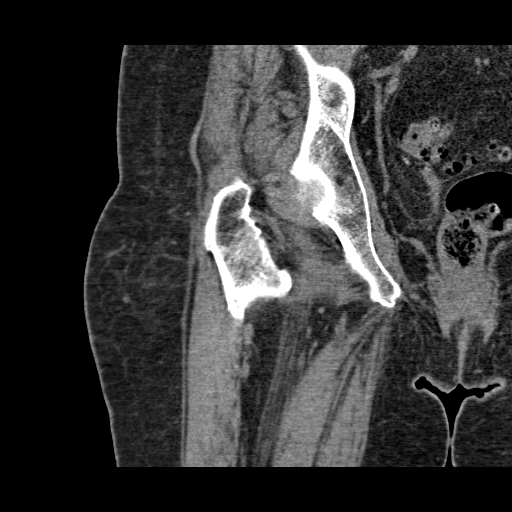

[14 of 46 positions shown; findings below may reference images not displayed]

FINDINGS: Examination is limited to the right hip and lower right hemipelvis.
The bones appear adequately mineralized. There is no evidence of
acute proximal femur fracture or dislocation. There is a mildly
displaced fracture of the right inferior pubic ramus which is new
and likely acute. The superior pubic ramus appears intact. There is
no evidence of acetabular fracture.

The visualized sacrum and right iliac bone are intact. Mild
degenerative changes of right sacroiliac joint and moderate osteitis
pubis noted.

There is no pelvic hematoma. Mild subcutaneous stranding is present
lateral to the proximal right femur. There is no large hip joint
effusion.

Prominent fat in the right inguinal canal appears unchanged. Sigmoid
diverticular changes are noted status post hysterectomy.
IMPRESSION: 1. Mildly displaced acute fracture of the right inferior pubic
ramus. No superior pubic rami fracture identified, although typical
injuries involve both pubic rami.
2. No evidence of proximal femur fracture or dislocation.
3. Osteitis pubis.
4. These results will be called to the ordering clinician or
representative by the [HOSPITAL] at the imaging location.

## 2015-10-05 DIAGNOSIS — M25511 Pain in right shoulder: Secondary | ICD-10-CM | POA: Diagnosis not present

## 2015-12-13 DIAGNOSIS — I10 Essential (primary) hypertension: Secondary | ICD-10-CM | POA: Diagnosis not present

## 2015-12-17 DIAGNOSIS — E559 Vitamin D deficiency, unspecified: Secondary | ICD-10-CM | POA: Diagnosis not present

## 2015-12-17 DIAGNOSIS — L570 Actinic keratosis: Secondary | ICD-10-CM | POA: Diagnosis not present

## 2015-12-17 DIAGNOSIS — I10 Essential (primary) hypertension: Secondary | ICD-10-CM | POA: Diagnosis not present

## 2015-12-17 DIAGNOSIS — E7800 Pure hypercholesterolemia, unspecified: Secondary | ICD-10-CM | POA: Diagnosis not present

## 2016-01-06 DIAGNOSIS — C50912 Malignant neoplasm of unspecified site of left female breast: Secondary | ICD-10-CM | POA: Diagnosis not present

## 2016-02-07 DIAGNOSIS — E7800 Pure hypercholesterolemia, unspecified: Secondary | ICD-10-CM | POA: Diagnosis not present

## 2016-02-07 DIAGNOSIS — Z86718 Personal history of other venous thrombosis and embolism: Secondary | ICD-10-CM | POA: Diagnosis not present

## 2016-02-10 DIAGNOSIS — I10 Essential (primary) hypertension: Secondary | ICD-10-CM | POA: Diagnosis not present

## 2016-02-10 DIAGNOSIS — Z86718 Personal history of other venous thrombosis and embolism: Secondary | ICD-10-CM | POA: Diagnosis not present

## 2016-02-11 DIAGNOSIS — C50912 Malignant neoplasm of unspecified site of left female breast: Secondary | ICD-10-CM | POA: Diagnosis not present

## 2016-03-20 DIAGNOSIS — L570 Actinic keratosis: Secondary | ICD-10-CM | POA: Diagnosis not present

## 2016-03-20 DIAGNOSIS — C44319 Basal cell carcinoma of skin of other parts of face: Secondary | ICD-10-CM | POA: Diagnosis not present

## 2016-03-20 DIAGNOSIS — D044 Carcinoma in situ of skin of scalp and neck: Secondary | ICD-10-CM | POA: Diagnosis not present

## 2016-03-25 ENCOUNTER — Ambulatory Visit (INDEPENDENT_AMBULATORY_CARE_PROVIDER_SITE_OTHER): Payer: PPO | Admitting: Family Medicine

## 2016-03-25 VITALS — BP 122/72 | HR 82 | Temp 98.2°F | Resp 17 | Ht 60.5 in | Wt 133.0 lb

## 2016-03-25 DIAGNOSIS — I10 Essential (primary) hypertension: Secondary | ICD-10-CM | POA: Diagnosis not present

## 2016-03-25 DIAGNOSIS — R05 Cough: Secondary | ICD-10-CM | POA: Diagnosis not present

## 2016-03-25 DIAGNOSIS — R059 Cough, unspecified: Secondary | ICD-10-CM

## 2016-03-25 MED ORDER — GUAIFENESIN ER 600 MG PO TB12: 600.0000 mg | ORAL_TABLET | Freq: Two times a day (BID) | ORAL | 0 refills | Status: DC

## 2016-03-25 MED ORDER — BENZONATATE 100 MG PO CAPS: 100.0000 mg | ORAL_CAPSULE | Freq: Two times a day (BID) | ORAL | 0 refills | Status: DC | PRN

## 2016-03-25 NOTE — Patient Instructions (Addendum)
     IF you received an x-ray today, you will receive an invoice from [facility], [facility], [facility]. Please contact [facility] at [phone] with questions or concerns regarding your invoice.   IF you received labwork today, you will receive an invoice from [facility]. Please contact [facility] at [phone] with questions or concerns regarding your invoice.   Our billing staff will not be able to assist you with questions regarding bills from these companies.  You will be contacted with the lab results as soon as they are available. The fastest way to get your results is to activate your My Chart account. Instructions are located on the last page of this paperwork. If you have not heard from [place] regarding the results in 2 weeks, please contact this office.      Cough, Adult Coughing is a reflex that clears your throat and your airways. Coughing helps to heal and protect your lungs. It is normal to cough occasionally, but a cough that happens with other symptoms or lasts a long time may be a sign of a condition that needs treatment. A cough may last only 2-3 weeks (acute), or it may last longer than 8 weeks (chronic). What are the causes? Coughing is commonly caused by:  Breathing in substances that irritate your lungs.  A viral or bacterial respiratory infection.  Allergies.  Asthma.  Postnasal drip.  Smoking.  Acid backing up from the stomach into the esophagus (gastroesophageal reflux).  Certain medicines.  Chronic lung problems, including COPD (or rarely, lung cancer).  Other medical conditions such as heart failure. Follow these instructions at home: Pay attention to any changes in your symptoms. Take these actions to help with your discomfort:  Take medicines only as told by your health care provider.  If you were prescribed an antibiotic medicine, take it as told by your health care provider. Do not stop taking the antibiotic even if you start to feel better.  Talk  with your health care provider before you take a cough suppressant medicine.  Drink enough fluid to keep your urine clear or pale yellow.  If the air is dry, use a cold steam vaporizer or humidifier in your bedroom or your home to help loosen secretions.  Avoid anything that causes you to cough at work or at home.  If your cough is worse at night, try sleeping in a semi-upright position.  Avoid cigarette smoke. If you smoke, quit smoking. If you need help quitting, ask your health care provider.  Avoid caffeine.  Avoid alcohol.  Rest as needed. Contact a health care provider if:  You have new symptoms.  You cough up pus.  Your cough does not get better after 2-3 weeks, or your cough gets worse.  You cannot control your cough with suppressant medicines and you are losing sleep.  You develop pain that is getting worse or pain that is not controlled with pain medicines.  You have a fever.  You have unexplained weight loss.  You have night sweats. Get help right away if:  You cough up blood.  You have difficulty breathing.  Your heartbeat is very fast. This information is not intended to replace advice given to you by your health care provider. Make sure you discuss any questions you have with your health care provider. Document Released: 07/01/2010 Document Revised: 06/10/2015 Document Reviewed: 03/11/2014 Elsevier Interactive Patient Education  2017 [employer].

## 2016-03-25 NOTE — Progress Notes (Signed)
Chief Complaint  Patient presents with  . Cough    HPI   Pt reports reports that she has been coughing for a week She has some chest congestion She has been taking OTC cough syrups She is here with her daughter who wants to check her out If she talks a lot she starts coughing She denies fevers or chills She is not short of breath She does not have congestion of the nose or sinuses at night  Hypertension Pt reports that she is not taking anything with dextromorphan because of her hypertension. She denies chest pains, palpitations, shortness of breath or lower extremity edema. She is compliant with medications.  BP Readings from Last 3 Encounters:  03/25/16 122/72  03/30/15 (!) 164/74  03/28/15 122/74     Past Medical History:  Diagnosis Date  . Cancer ([place])   . History of blood clots   . Hypertension     Current Outpatient Prescriptions  Medication Sig Dispense Refill  . amLODipine (NORVASC) 2.5 MG tablet     . fosinopril (MONOPRIL) 40 MG tablet     . lovastatin (MEVACOR) 40 MG tablet     . metoprolol (LOPRESSOR) 50 MG tablet     . pantoprazole (PROTONIX) 40 MG tablet     . XARELTO 20 MG TABS tablet     . benzonatate (TESSALON) 100 MG capsule Take 1 capsule (100 mg total) by mouth 2 (two) times daily as needed for cough. 30 capsule 0  . guaiFENesin (MUCINEX) 600 MG 12 hr tablet Take 1 tablet (600 mg total) by mouth 2 (two) times daily. 30 tablet 0   No current facility-administered medications for this visit.     Allergies:  Allergies  Allergen Reactions  . Prilosec [Omeprazole] Rash    Past Surgical History:  Procedure Laterality Date  . ABDOMINAL HYSTERECTOMY    . BREAST SURGERY    . EYE SURGERY    . ROTATOR CUFF REPAIR    . spot removed from lung      Social History   Social History  . Marital status: Widowed    Spouse name: N/A  . Number of children: N/A  . Years of education: N/A   Social History Main Topics  . Smoking status: Never Smoker   . Smokeless tobacco: Never Used  . Alcohol use No  . Drug use: No  . Sexual activity: No   Other Topics Concern  . None   Social History Narrative  . None    ROS See hpi  Objective: Vitals:   03/25/16 1204  BP: 122/72  Pulse: 82  Resp: 17  Temp: 98.2 F (36.8 C)  TempSrc: Oral  SpO2: 97%  Weight: 133 lb (60.3 kg)  Height: 5' 0.5" (1.537 m)    Physical Exam General: alert, oriented, in NAD Head: normocephalic, atraumatic, no sinus tenderness Eyes: EOM intact, no scleral icterus or conjunctival injection Ears: TM clear bilaterally Throat: no pharyngeal exudate or erythema Lymph: no posterior auricular, submental or cervical lymph adenopathy Heart: normal rate, normal sinus rhythm, no murmurs Lungs: kyphosis noted, clear to auscultation bilaterally, no wheezing   Assessment and Plan Stephanie Pearson was seen today for cough.  Diagnoses and all orders for this visit:  Cough -     benzonatate (TESSALON) 100 MG capsule; Take 1 capsule (100 mg total) by mouth 2 (two) times daily as needed for cough. -     guaiFENesin (MUCINEX) 600 MG 12 hr tablet; Take 1 tablet (600 mg total) by  mouth 2 (two) times daily.  Essential hypertension Continue current medications for bp control Avoid cough meds with phenylephrine and dextromorphan       Stephanie Pearson A Stephanie Pearson

## 2016-03-27 DIAGNOSIS — I10 Essential (primary) hypertension: Secondary | ICD-10-CM | POA: Insufficient documentation

## 2016-04-03 DIAGNOSIS — D044 Carcinoma in situ of skin of scalp and neck: Secondary | ICD-10-CM | POA: Diagnosis not present

## 2016-04-03 DIAGNOSIS — Z85828 Personal history of other malignant neoplasm of skin: Secondary | ICD-10-CM | POA: Diagnosis not present

## 2016-04-03 DIAGNOSIS — C44319 Basal cell carcinoma of skin of other parts of face: Secondary | ICD-10-CM | POA: Diagnosis not present

## 2016-04-17 DIAGNOSIS — I10 Essential (primary) hypertension: Secondary | ICD-10-CM | POA: Diagnosis not present

## 2016-04-17 DIAGNOSIS — E559 Vitamin D deficiency, unspecified: Secondary | ICD-10-CM | POA: Diagnosis not present

## 2016-04-24 DIAGNOSIS — E7800 Pure hypercholesterolemia, unspecified: Secondary | ICD-10-CM | POA: Diagnosis not present

## 2016-04-24 DIAGNOSIS — M8580 Other specified disorders of bone density and structure, unspecified site: Secondary | ICD-10-CM | POA: Diagnosis not present

## 2016-04-24 DIAGNOSIS — I10 Essential (primary) hypertension: Secondary | ICD-10-CM | POA: Diagnosis not present

## 2016-04-24 DIAGNOSIS — Z0000 Encounter for general adult medical examination without abnormal findings: Secondary | ICD-10-CM | POA: Diagnosis not present

## 2016-04-24 DIAGNOSIS — I82409 Acute embolism and thrombosis of unspecified deep veins of unspecified lower extremity: Secondary | ICD-10-CM | POA: Diagnosis not present

## 2016-04-24 DIAGNOSIS — Z23 Encounter for immunization: Secondary | ICD-10-CM | POA: Diagnosis not present

## 2016-05-08 ENCOUNTER — Other Ambulatory Visit: Payer: Self-pay | Admitting: Internal Medicine

## 2016-05-08 DIAGNOSIS — Z9012 Acquired absence of left breast and nipple: Secondary | ICD-10-CM

## 2016-05-08 DIAGNOSIS — Z1231 Encounter for screening mammogram for malignant neoplasm of breast: Secondary | ICD-10-CM

## 2016-05-18 DIAGNOSIS — H6122 Impacted cerumen, left ear: Secondary | ICD-10-CM | POA: Diagnosis not present

## 2016-06-28 ENCOUNTER — Ambulatory Visit
Admission: RE | Admit: 2016-06-28 | Discharge: 2016-06-28 | Disposition: A | Discharge status: Home / Self Care | Payer: PPO | Source: Ambulatory Visit | Attending: Internal Medicine | Admitting: Internal Medicine

## 2016-06-28 DIAGNOSIS — Z1231 Encounter for screening mammogram for malignant neoplasm of breast: Secondary | ICD-10-CM | POA: Diagnosis not present

## 2016-06-28 DIAGNOSIS — Z9012 Acquired absence of left breast and nipple: Secondary | ICD-10-CM

## 2016-06-28 HISTORY — DX: Personal history of antineoplastic chemotherapy: Z92.21

## 2016-07-03 DIAGNOSIS — Z9849 Cataract extraction status, unspecified eye: Secondary | ICD-10-CM | POA: Diagnosis not present

## 2016-07-03 DIAGNOSIS — H524 Presbyopia: Secondary | ICD-10-CM | POA: Diagnosis not present

## 2016-07-03 DIAGNOSIS — H52223 Regular astigmatism, bilateral: Secondary | ICD-10-CM | POA: Diagnosis not present

## 2016-07-03 DIAGNOSIS — Z961 Presence of intraocular lens: Secondary | ICD-10-CM | POA: Diagnosis not present

## 2016-07-03 DIAGNOSIS — H5203 Hypermetropia, bilateral: Secondary | ICD-10-CM | POA: Diagnosis not present

## 2016-08-10 ENCOUNTER — Encounter: Payer: Self-pay | Admitting: Podiatry

## 2016-08-10 ENCOUNTER — Ambulatory Visit (INDEPENDENT_AMBULATORY_CARE_PROVIDER_SITE_OTHER): Payer: PPO | Admitting: Podiatry

## 2016-08-10 DIAGNOSIS — L600 Ingrowing nail: Secondary | ICD-10-CM | POA: Diagnosis not present

## 2016-08-10 NOTE — Progress Notes (Signed)
   Subjective:    Patient ID: Stephanie Pearson, female    DOB: 1927/08/17, 83 y.o.   MRN: [id number]  HPI  Chief Complaint  Patient presents with  . Nail Problem    Great toe Rt foot split onset 5 weeks       Review of Systems  Constitutional: Positive for unexpected weight change.  HENT: Positive for hearing loss.   Hematological: Bruises/bleeds easily.  All other systems reviewed and are negative.      Objective:   Physical Exam        Assessment & Plan:

## 2016-08-10 NOTE — Patient Instructions (Signed)

## 2016-08-11 NOTE — Progress Notes (Signed)
Subjective:    Patient ID: Stephanie Pearson, female   DOB: 83 y.o.   MRN: [id number]   HPI patient states she's been getting a lot of pain in her right big toenail and that it's making it more difficult for her to wear shoe gear comfortably    ROS      Objective:  Physical Exam neurovascular status intact with incurvated lateral border right hallux painful when pressed localized in nature     Assessment:     Ingrown toenail deformity right hallux lateral border with pain     Plan:    H&P condition reviewed and recommended removal of the nail corner. Explained procedure and risk and patient wants surgery and today I infiltrated the right hallux 60 Milligan times like Marcaine mixture remove the lateral border exposed matrix and applied phenol 3 applications 30 seconds followed by alcohol lavage. I explained drainage can last approximate 6-8 weeks and patient be seen back as needed

## 2016-09-25 DIAGNOSIS — E785 Hyperlipidemia, unspecified: Secondary | ICD-10-CM | POA: Diagnosis not present

## 2016-09-25 DIAGNOSIS — I10 Essential (primary) hypertension: Secondary | ICD-10-CM | POA: Diagnosis not present

## 2016-10-23 DIAGNOSIS — M8580 Other specified disorders of bone density and structure, unspecified site: Secondary | ICD-10-CM | POA: Diagnosis not present

## 2016-10-23 DIAGNOSIS — I10 Essential (primary) hypertension: Secondary | ICD-10-CM | POA: Diagnosis not present

## 2016-12-20 DIAGNOSIS — Z0000 Encounter for general adult medical examination without abnormal findings: Secondary | ICD-10-CM | POA: Diagnosis not present

## 2016-12-20 DIAGNOSIS — I10 Essential (primary) hypertension: Secondary | ICD-10-CM | POA: Diagnosis not present

## 2017-01-31 ENCOUNTER — Other Ambulatory Visit: Payer: Self-pay | Admitting: Internal Medicine

## 2017-01-31 ENCOUNTER — Ambulatory Visit
Admission: RE | Admit: 2017-01-31 | Discharge: 2017-01-31 | Disposition: A | Discharge status: Home / Self Care | Payer: PPO | Source: Ambulatory Visit | Attending: Internal Medicine | Admitting: Internal Medicine

## 2017-01-31 DIAGNOSIS — Z7901 Long term (current) use of anticoagulants: Secondary | ICD-10-CM | POA: Diagnosis not present

## 2017-01-31 DIAGNOSIS — M79661 Pain in right lower leg: Secondary | ICD-10-CM | POA: Diagnosis not present

## 2017-01-31 DIAGNOSIS — M79604 Pain in right leg: Secondary | ICD-10-CM

## 2017-01-31 DIAGNOSIS — Z86718 Personal history of other venous thrombosis and embolism: Secondary | ICD-10-CM | POA: Diagnosis not present

## 2017-02-14 DIAGNOSIS — I10 Essential (primary) hypertension: Secondary | ICD-10-CM | POA: Diagnosis not present

## 2017-02-14 DIAGNOSIS — E785 Hyperlipidemia, unspecified: Secondary | ICD-10-CM | POA: Diagnosis not present

## 2017-02-19 DIAGNOSIS — R05 Cough: Secondary | ICD-10-CM | POA: Diagnosis not present

## 2017-02-19 DIAGNOSIS — I10 Essential (primary) hypertension: Secondary | ICD-10-CM | POA: Diagnosis not present

## 2017-02-19 DIAGNOSIS — J019 Acute sinusitis, unspecified: Secondary | ICD-10-CM | POA: Diagnosis not present

## 2017-04-11 DIAGNOSIS — M545 Low back pain: Secondary | ICD-10-CM | POA: Diagnosis not present

## 2017-04-16 DIAGNOSIS — T148XXA Other injury of unspecified body region, initial encounter: Secondary | ICD-10-CM | POA: Diagnosis not present

## 2017-04-16 DIAGNOSIS — E7800 Pure hypercholesterolemia, unspecified: Secondary | ICD-10-CM | POA: Diagnosis not present

## 2017-04-16 DIAGNOSIS — M545 Low back pain: Secondary | ICD-10-CM | POA: Diagnosis not present

## 2017-04-16 DIAGNOSIS — I10 Essential (primary) hypertension: Secondary | ICD-10-CM | POA: Diagnosis not present

## 2017-05-16 DIAGNOSIS — M4316 Spondylolisthesis, lumbar region: Secondary | ICD-10-CM | POA: Diagnosis not present

## 2017-05-16 DIAGNOSIS — M5136 Other intervertebral disc degeneration, lumbar region: Secondary | ICD-10-CM | POA: Diagnosis not present

## 2017-05-21 ENCOUNTER — Other Ambulatory Visit: Payer: Self-pay | Admitting: Internal Medicine

## 2017-05-21 DIAGNOSIS — Z1231 Encounter for screening mammogram for malignant neoplasm of breast: Secondary | ICD-10-CM

## 2017-05-22 DIAGNOSIS — M5136 Other intervertebral disc degeneration, lumbar region: Secondary | ICD-10-CM | POA: Diagnosis not present

## 2017-06-21 DIAGNOSIS — I10 Essential (primary) hypertension: Secondary | ICD-10-CM | POA: Diagnosis not present

## 2017-06-28 DIAGNOSIS — I10 Essential (primary) hypertension: Secondary | ICD-10-CM | POA: Diagnosis not present

## 2017-06-28 DIAGNOSIS — E7800 Pure hypercholesterolemia, unspecified: Secondary | ICD-10-CM | POA: Diagnosis not present

## 2017-06-28 DIAGNOSIS — Z0000 Encounter for general adult medical examination without abnormal findings: Secondary | ICD-10-CM | POA: Diagnosis not present

## 2017-06-28 DIAGNOSIS — E559 Vitamin D deficiency, unspecified: Secondary | ICD-10-CM | POA: Diagnosis not present

## 2017-07-03 ENCOUNTER — Ambulatory Visit
Admission: RE | Admit: 2017-07-03 | Discharge: 2017-07-03 | Disposition: A | Discharge status: Home / Self Care | Payer: PPO | Source: Ambulatory Visit | Attending: Internal Medicine | Admitting: Internal Medicine

## 2017-07-03 DIAGNOSIS — Z1231 Encounter for screening mammogram for malignant neoplasm of breast: Secondary | ICD-10-CM | POA: Diagnosis not present

## 2017-07-09 DIAGNOSIS — H26493 Other secondary cataract, bilateral: Secondary | ICD-10-CM | POA: Diagnosis not present

## 2017-09-24 DIAGNOSIS — C50912 Malignant neoplasm of unspecified site of left female breast: Secondary | ICD-10-CM | POA: Diagnosis not present

## 2017-10-11 DIAGNOSIS — C50912 Malignant neoplasm of unspecified site of left female breast: Secondary | ICD-10-CM | POA: Diagnosis not present

## 2017-12-24 DIAGNOSIS — E559 Vitamin D deficiency, unspecified: Secondary | ICD-10-CM | POA: Diagnosis not present

## 2017-12-24 DIAGNOSIS — N390 Urinary tract infection, site not specified: Secondary | ICD-10-CM | POA: Diagnosis not present

## 2017-12-24 DIAGNOSIS — Z0000 Encounter for general adult medical examination without abnormal findings: Secondary | ICD-10-CM | POA: Diagnosis not present

## 2017-12-24 DIAGNOSIS — I10 Essential (primary) hypertension: Secondary | ICD-10-CM | POA: Diagnosis not present

## 2017-12-31 DIAGNOSIS — H6120 Impacted cerumen, unspecified ear: Secondary | ICD-10-CM | POA: Diagnosis not present

## 2017-12-31 DIAGNOSIS — I10 Essential (primary) hypertension: Secondary | ICD-10-CM | POA: Diagnosis not present

## 2017-12-31 DIAGNOSIS — E7800 Pure hypercholesterolemia, unspecified: Secondary | ICD-10-CM | POA: Diagnosis not present

## 2017-12-31 DIAGNOSIS — I82409 Acute embolism and thrombosis of unspecified deep veins of unspecified lower extremity: Secondary | ICD-10-CM | POA: Diagnosis not present

## 2017-12-31 DIAGNOSIS — M8580 Other specified disorders of bone density and structure, unspecified site: Secondary | ICD-10-CM | POA: Diagnosis not present

## 2017-12-31 DIAGNOSIS — Z86718 Personal history of other venous thrombosis and embolism: Secondary | ICD-10-CM | POA: Diagnosis not present

## 2017-12-31 DIAGNOSIS — G4700 Insomnia, unspecified: Secondary | ICD-10-CM | POA: Diagnosis not present

## 2017-12-31 DIAGNOSIS — K219 Gastro-esophageal reflux disease without esophagitis: Secondary | ICD-10-CM | POA: Diagnosis not present

## 2018-01-15 DIAGNOSIS — H6120 Impacted cerumen, unspecified ear: Secondary | ICD-10-CM | POA: Diagnosis not present

## 2018-02-01 DIAGNOSIS — Z974 Presence of external hearing-aid: Secondary | ICD-10-CM | POA: Diagnosis not present

## 2018-02-01 DIAGNOSIS — H6123 Impacted cerumen, bilateral: Secondary | ICD-10-CM | POA: Diagnosis not present

## 2018-02-01 DIAGNOSIS — H903 Sensorineural hearing loss, bilateral: Secondary | ICD-10-CM | POA: Diagnosis not present

## 2018-02-01 DIAGNOSIS — H6121 Impacted cerumen, right ear: Secondary | ICD-10-CM | POA: Diagnosis not present

## 2018-02-13 DIAGNOSIS — J0100 Acute maxillary sinusitis, unspecified: Secondary | ICD-10-CM | POA: Diagnosis not present

## 2018-02-13 DIAGNOSIS — I10 Essential (primary) hypertension: Secondary | ICD-10-CM | POA: Diagnosis not present

## 2018-06-19 DIAGNOSIS — H903 Sensorineural hearing loss, bilateral: Secondary | ICD-10-CM | POA: Diagnosis not present

## 2018-06-19 DIAGNOSIS — H6121 Impacted cerumen, right ear: Secondary | ICD-10-CM | POA: Diagnosis not present

## 2018-06-19 DIAGNOSIS — H6122 Impacted cerumen, left ear: Secondary | ICD-10-CM | POA: Diagnosis not present

## 2018-06-19 DIAGNOSIS — Z974 Presence of external hearing-aid: Secondary | ICD-10-CM | POA: Diagnosis not present

## 2018-07-01 DIAGNOSIS — E039 Hypothyroidism, unspecified: Secondary | ICD-10-CM | POA: Diagnosis not present

## 2018-07-01 DIAGNOSIS — I10 Essential (primary) hypertension: Secondary | ICD-10-CM | POA: Diagnosis not present

## 2018-07-01 DIAGNOSIS — Z0000 Encounter for general adult medical examination without abnormal findings: Secondary | ICD-10-CM | POA: Diagnosis not present

## 2018-07-01 DIAGNOSIS — E559 Vitamin D deficiency, unspecified: Secondary | ICD-10-CM | POA: Diagnosis not present

## 2018-07-01 DIAGNOSIS — E785 Hyperlipidemia, unspecified: Secondary | ICD-10-CM | POA: Diagnosis not present

## 2018-07-08 DIAGNOSIS — R300 Dysuria: Secondary | ICD-10-CM | POA: Diagnosis not present

## 2018-07-08 DIAGNOSIS — Z7189 Other specified counseling: Secondary | ICD-10-CM | POA: Diagnosis not present

## 2018-07-08 DIAGNOSIS — E559 Vitamin D deficiency, unspecified: Secondary | ICD-10-CM | POA: Diagnosis not present

## 2018-07-08 DIAGNOSIS — E785 Hyperlipidemia, unspecified: Secondary | ICD-10-CM | POA: Diagnosis not present

## 2018-07-08 DIAGNOSIS — Z0000 Encounter for general adult medical examination without abnormal findings: Secondary | ICD-10-CM | POA: Diagnosis not present

## 2018-07-08 DIAGNOSIS — E039 Hypothyroidism, unspecified: Secondary | ICD-10-CM | POA: Diagnosis not present

## 2018-07-08 DIAGNOSIS — I10 Essential (primary) hypertension: Secondary | ICD-10-CM | POA: Diagnosis not present

## 2018-07-08 DIAGNOSIS — Z86718 Personal history of other venous thrombosis and embolism: Secondary | ICD-10-CM | POA: Diagnosis not present

## 2018-07-08 DIAGNOSIS — E7800 Pure hypercholesterolemia, unspecified: Secondary | ICD-10-CM | POA: Diagnosis not present

## 2018-08-15 DIAGNOSIS — H5203 Hypermetropia, bilateral: Secondary | ICD-10-CM | POA: Diagnosis not present

## 2018-08-15 DIAGNOSIS — H26493 Other secondary cataract, bilateral: Secondary | ICD-10-CM | POA: Diagnosis not present

## 2018-08-15 DIAGNOSIS — H52223 Regular astigmatism, bilateral: Secondary | ICD-10-CM | POA: Diagnosis not present

## 2018-08-15 DIAGNOSIS — H524 Presbyopia: Secondary | ICD-10-CM | POA: Diagnosis not present

## 2018-08-19 ENCOUNTER — Other Ambulatory Visit: Payer: Self-pay

## 2018-09-04 DIAGNOSIS — R21 Rash and other nonspecific skin eruption: Secondary | ICD-10-CM | POA: Diagnosis not present

## 2018-09-25 DIAGNOSIS — H903 Sensorineural hearing loss, bilateral: Secondary | ICD-10-CM | POA: Diagnosis not present

## 2018-09-25 DIAGNOSIS — R490 Dysphonia: Secondary | ICD-10-CM | POA: Diagnosis not present

## 2018-10-02 DIAGNOSIS — C50912 Malignant neoplasm of unspecified site of left female breast: Secondary | ICD-10-CM | POA: Diagnosis not present

## 2018-10-23 DIAGNOSIS — C50912 Malignant neoplasm of unspecified site of left female breast: Secondary | ICD-10-CM | POA: Diagnosis not present

## 2019-01-03 DIAGNOSIS — Z0000 Encounter for general adult medical examination without abnormal findings: Secondary | ICD-10-CM | POA: Diagnosis not present

## 2019-01-03 DIAGNOSIS — E039 Hypothyroidism, unspecified: Secondary | ICD-10-CM | POA: Diagnosis not present

## 2019-01-03 DIAGNOSIS — E559 Vitamin D deficiency, unspecified: Secondary | ICD-10-CM | POA: Diagnosis not present

## 2019-01-03 DIAGNOSIS — E785 Hyperlipidemia, unspecified: Secondary | ICD-10-CM | POA: Diagnosis not present

## 2019-01-03 DIAGNOSIS — I10 Essential (primary) hypertension: Secondary | ICD-10-CM | POA: Diagnosis not present

## 2019-01-07 DIAGNOSIS — Z0000 Encounter for general adult medical examination without abnormal findings: Secondary | ICD-10-CM | POA: Diagnosis not present

## 2019-01-07 DIAGNOSIS — G4700 Insomnia, unspecified: Secondary | ICD-10-CM | POA: Diagnosis not present

## 2019-01-07 DIAGNOSIS — E785 Hyperlipidemia, unspecified: Secondary | ICD-10-CM | POA: Diagnosis not present

## 2019-01-07 DIAGNOSIS — Z86718 Personal history of other venous thrombosis and embolism: Secondary | ICD-10-CM | POA: Diagnosis not present

## 2019-01-07 DIAGNOSIS — I10 Essential (primary) hypertension: Secondary | ICD-10-CM | POA: Diagnosis not present

## 2019-01-14 DIAGNOSIS — H61892 Other specified disorders of left external ear: Secondary | ICD-10-CM | POA: Diagnosis not present

## 2019-01-14 DIAGNOSIS — Z974 Presence of external hearing-aid: Secondary | ICD-10-CM | POA: Diagnosis not present

## 2019-01-14 DIAGNOSIS — H903 Sensorineural hearing loss, bilateral: Secondary | ICD-10-CM | POA: Diagnosis not present

## 2019-01-14 DIAGNOSIS — H6122 Impacted cerumen, left ear: Secondary | ICD-10-CM | POA: Diagnosis not present

## 2019-02-21 DIAGNOSIS — H6122 Impacted cerumen, left ear: Secondary | ICD-10-CM | POA: Diagnosis not present

## 2019-02-21 DIAGNOSIS — H903 Sensorineural hearing loss, bilateral: Secondary | ICD-10-CM | POA: Diagnosis not present

## 2019-03-05 DIAGNOSIS — R55 Syncope and collapse: Secondary | ICD-10-CM | POA: Diagnosis not present

## 2019-04-08 DIAGNOSIS — R55 Syncope and collapse: Secondary | ICD-10-CM | POA: Diagnosis not present

## 2019-05-26 DIAGNOSIS — B354 Tinea corporis: Secondary | ICD-10-CM | POA: Diagnosis not present

## 2019-05-26 DIAGNOSIS — R21 Rash and other nonspecific skin eruption: Secondary | ICD-10-CM | POA: Diagnosis not present

## 2019-06-18 DIAGNOSIS — D485 Neoplasm of uncertain behavior of skin: Secondary | ICD-10-CM | POA: Diagnosis not present

## 2019-06-18 DIAGNOSIS — D2239 Melanocytic nevi of other parts of face: Secondary | ICD-10-CM | POA: Diagnosis not present

## 2019-07-01 DIAGNOSIS — C4441 Basal cell carcinoma of skin of scalp and neck: Secondary | ICD-10-CM | POA: Diagnosis not present

## 2019-07-23 DIAGNOSIS — N289 Disorder of kidney and ureter, unspecified: Secondary | ICD-10-CM | POA: Diagnosis not present

## 2019-07-23 DIAGNOSIS — Z299 Encounter for prophylactic measures, unspecified: Secondary | ICD-10-CM | POA: Diagnosis not present

## 2019-07-23 DIAGNOSIS — G4709 Other insomnia: Secondary | ICD-10-CM | POA: Diagnosis not present

## 2019-07-23 DIAGNOSIS — E782 Mixed hyperlipidemia: Secondary | ICD-10-CM | POA: Diagnosis not present

## 2019-07-23 DIAGNOSIS — I10 Essential (primary) hypertension: Secondary | ICD-10-CM | POA: Diagnosis not present

## 2019-08-05 DIAGNOSIS — B373 Candidiasis of vulva and vagina: Secondary | ICD-10-CM | POA: Diagnosis not present

## 2019-08-19 DIAGNOSIS — N289 Disorder of kidney and ureter, unspecified: Secondary | ICD-10-CM | POA: Diagnosis not present

## 2019-08-19 DIAGNOSIS — B354 Tinea corporis: Secondary | ICD-10-CM | POA: Diagnosis not present

## 2019-08-26 DIAGNOSIS — H04129 Dry eye syndrome of unspecified lacrimal gland: Secondary | ICD-10-CM | POA: Diagnosis not present

## 2019-08-26 DIAGNOSIS — Z961 Presence of intraocular lens: Secondary | ICD-10-CM | POA: Diagnosis not present

## 2019-08-26 DIAGNOSIS — H524 Presbyopia: Secondary | ICD-10-CM | POA: Diagnosis not present

## 2019-08-28 DIAGNOSIS — C50912 Malignant neoplasm of unspecified site of left female breast: Secondary | ICD-10-CM | POA: Diagnosis not present

## 2019-09-02 DIAGNOSIS — H9193 Unspecified hearing loss, bilateral: Secondary | ICD-10-CM | POA: Diagnosis not present

## 2019-09-02 DIAGNOSIS — Z974 Presence of external hearing-aid: Secondary | ICD-10-CM | POA: Diagnosis not present

## 2019-09-02 DIAGNOSIS — H6123 Impacted cerumen, bilateral: Secondary | ICD-10-CM | POA: Diagnosis not present

## 2019-10-29 DIAGNOSIS — N1831 Chronic kidney disease, stage 3a: Secondary | ICD-10-CM | POA: Diagnosis not present

## 2019-10-29 DIAGNOSIS — I129 Hypertensive chronic kidney disease with stage 1 through stage 4 chronic kidney disease, or unspecified chronic kidney disease: Secondary | ICD-10-CM | POA: Diagnosis not present

## 2019-10-29 DIAGNOSIS — Z7901 Long term (current) use of anticoagulants: Secondary | ICD-10-CM | POA: Diagnosis not present

## 2019-10-29 DIAGNOSIS — Z86718 Personal history of other venous thrombosis and embolism: Secondary | ICD-10-CM | POA: Diagnosis not present

## 2019-12-31 DIAGNOSIS — N1831 Chronic kidney disease, stage 3a: Secondary | ICD-10-CM | POA: Diagnosis not present

## 2019-12-31 DIAGNOSIS — Z86718 Personal history of other venous thrombosis and embolism: Secondary | ICD-10-CM | POA: Diagnosis not present

## 2019-12-31 DIAGNOSIS — Z0000 Encounter for general adult medical examination without abnormal findings: Secondary | ICD-10-CM | POA: Diagnosis not present

## 2020-03-09 DIAGNOSIS — Z974 Presence of external hearing-aid: Secondary | ICD-10-CM | POA: Diagnosis not present

## 2020-03-09 DIAGNOSIS — H9193 Unspecified hearing loss, bilateral: Secondary | ICD-10-CM | POA: Diagnosis not present

## 2020-03-09 DIAGNOSIS — H6123 Impacted cerumen, bilateral: Secondary | ICD-10-CM | POA: Diagnosis not present

## 2020-03-24 DIAGNOSIS — H903 Sensorineural hearing loss, bilateral: Secondary | ICD-10-CM | POA: Diagnosis not present

## 2020-05-19 DIAGNOSIS — K2100 Gastro-esophageal reflux disease with esophagitis, without bleeding: Secondary | ICD-10-CM | POA: Diagnosis not present

## 2020-05-19 DIAGNOSIS — E782 Mixed hyperlipidemia: Secondary | ICD-10-CM | POA: Diagnosis not present

## 2020-05-19 DIAGNOSIS — I10 Essential (primary) hypertension: Secondary | ICD-10-CM | POA: Diagnosis not present

## 2020-05-19 DIAGNOSIS — G4709 Other insomnia: Secondary | ICD-10-CM | POA: Diagnosis not present

## 2020-07-01 DIAGNOSIS — R350 Frequency of micturition: Secondary | ICD-10-CM | POA: Diagnosis not present

## 2020-07-01 DIAGNOSIS — N1831 Chronic kidney disease, stage 3a: Secondary | ICD-10-CM | POA: Diagnosis not present

## 2020-07-01 DIAGNOSIS — N3000 Acute cystitis without hematuria: Secondary | ICD-10-CM | POA: Diagnosis not present

## 2020-08-24 DIAGNOSIS — Z299 Encounter for prophylactic measures, unspecified: Secondary | ICD-10-CM | POA: Diagnosis not present

## 2020-08-24 DIAGNOSIS — I10 Essential (primary) hypertension: Secondary | ICD-10-CM | POA: Diagnosis not present

## 2020-08-24 DIAGNOSIS — G4709 Other insomnia: Secondary | ICD-10-CM | POA: Diagnosis not present

## 2020-08-24 DIAGNOSIS — K2100 Gastro-esophageal reflux disease with esophagitis, without bleeding: Secondary | ICD-10-CM | POA: Diagnosis not present

## 2020-08-24 DIAGNOSIS — E782 Mixed hyperlipidemia: Secondary | ICD-10-CM | POA: Diagnosis not present

## 2020-09-01 DIAGNOSIS — H04129 Dry eye syndrome of unspecified lacrimal gland: Secondary | ICD-10-CM | POA: Diagnosis not present

## 2020-09-01 DIAGNOSIS — Z961 Presence of intraocular lens: Secondary | ICD-10-CM | POA: Diagnosis not present

## 2020-09-01 DIAGNOSIS — H524 Presbyopia: Secondary | ICD-10-CM | POA: Diagnosis not present

## 2020-09-24 DIAGNOSIS — C50912 Malignant neoplasm of unspecified site of left female breast: Secondary | ICD-10-CM | POA: Diagnosis not present

## 2020-10-05 DIAGNOSIS — C50912 Malignant neoplasm of unspecified site of left female breast: Secondary | ICD-10-CM | POA: Diagnosis not present

## 2020-10-10 DIAGNOSIS — R6883 Chills (without fever): Secondary | ICD-10-CM | POA: Diagnosis not present

## 2020-10-10 DIAGNOSIS — R300 Dysuria: Secondary | ICD-10-CM | POA: Diagnosis not present

## 2020-10-11 DIAGNOSIS — R82998 Other abnormal findings in urine: Secondary | ICD-10-CM | POA: Diagnosis not present

## 2020-10-13 DIAGNOSIS — R748 Abnormal levels of other serum enzymes: Secondary | ICD-10-CM | POA: Diagnosis not present

## 2020-10-13 DIAGNOSIS — R945 Abnormal results of liver function studies: Secondary | ICD-10-CM | POA: Diagnosis not present

## 2020-10-18 DIAGNOSIS — K8050 Calculus of bile duct without cholangitis or cholecystitis without obstruction: Secondary | ICD-10-CM | POA: Diagnosis not present

## 2020-10-18 DIAGNOSIS — Z86718 Personal history of other venous thrombosis and embolism: Secondary | ICD-10-CM | POA: Diagnosis not present

## 2020-10-18 DIAGNOSIS — E785 Hyperlipidemia, unspecified: Secondary | ICD-10-CM | POA: Diagnosis not present

## 2020-10-18 DIAGNOSIS — K8032 Calculus of bile duct with acute cholangitis without obstruction: Secondary | ICD-10-CM | POA: Diagnosis not present

## 2020-10-18 DIAGNOSIS — R1010 Upper abdominal pain, unspecified: Secondary | ICD-10-CM | POA: Diagnosis not present

## 2020-10-18 DIAGNOSIS — K219 Gastro-esophageal reflux disease without esophagitis: Secondary | ICD-10-CM | POA: Diagnosis not present

## 2020-10-18 DIAGNOSIS — I10 Essential (primary) hypertension: Secondary | ICD-10-CM | POA: Diagnosis not present

## 2020-10-18 DIAGNOSIS — K225 Diverticulum of esophagus, acquired: Secondary | ICD-10-CM | POA: Diagnosis not present

## 2020-10-18 DIAGNOSIS — R7401 Elevation of levels of liver transaminase levels: Secondary | ICD-10-CM | POA: Diagnosis not present

## 2020-10-18 DIAGNOSIS — K851 Biliary acute pancreatitis without necrosis or infection: Secondary | ICD-10-CM | POA: Diagnosis not present

## 2020-10-18 DIAGNOSIS — K8080 Other cholelithiasis without obstruction: Secondary | ICD-10-CM | POA: Diagnosis not present

## 2020-10-18 DIAGNOSIS — K449 Diaphragmatic hernia without obstruction or gangrene: Secondary | ICD-10-CM | POA: Diagnosis not present

## 2020-10-18 DIAGNOSIS — K222 Esophageal obstruction: Secondary | ICD-10-CM | POA: Diagnosis not present

## 2020-10-18 DIAGNOSIS — Z7901 Long term (current) use of anticoagulants: Secondary | ICD-10-CM | POA: Diagnosis not present

## 2020-10-18 DIAGNOSIS — R945 Abnormal results of liver function studies: Secondary | ICD-10-CM | POA: Diagnosis not present

## 2020-10-18 DIAGNOSIS — K859 Acute pancreatitis without necrosis or infection, unspecified: Secondary | ICD-10-CM | POA: Diagnosis not present

## 2020-10-26 DIAGNOSIS — Z09 Encounter for follow-up examination after completed treatment for conditions other than malignant neoplasm: Secondary | ICD-10-CM | POA: Diagnosis not present

## 2020-10-26 DIAGNOSIS — K851 Biliary acute pancreatitis without necrosis or infection: Secondary | ICD-10-CM | POA: Diagnosis not present

## 2020-11-09 DIAGNOSIS — R748 Abnormal levels of other serum enzymes: Secondary | ICD-10-CM | POA: Diagnosis not present

## 2020-11-09 DIAGNOSIS — K851 Biliary acute pancreatitis without necrosis or infection: Secondary | ICD-10-CM | POA: Diagnosis not present

## 2020-11-25 DIAGNOSIS — K8050 Calculus of bile duct without cholangitis or cholecystitis without obstruction: Secondary | ICD-10-CM | POA: Diagnosis not present

## 2020-11-25 DIAGNOSIS — K222 Esophageal obstruction: Secondary | ICD-10-CM | POA: Diagnosis not present

## 2020-12-28 DIAGNOSIS — R1310 Dysphagia, unspecified: Secondary | ICD-10-CM | POA: Diagnosis not present

## 2020-12-28 DIAGNOSIS — K449 Diaphragmatic hernia without obstruction or gangrene: Secondary | ICD-10-CM | POA: Diagnosis not present

## 2020-12-28 DIAGNOSIS — K222 Esophageal obstruction: Secondary | ICD-10-CM | POA: Diagnosis not present

## 2020-12-28 DIAGNOSIS — K225 Diverticulum of esophagus, acquired: Secondary | ICD-10-CM | POA: Diagnosis not present

## 2020-12-28 DIAGNOSIS — D131 Benign neoplasm of stomach: Secondary | ICD-10-CM | POA: Diagnosis not present

## 2020-12-28 DIAGNOSIS — K317 Polyp of stomach and duodenum: Secondary | ICD-10-CM | POA: Diagnosis not present

## 2022-01-21 DIAGNOSIS — Z853 Personal history of malignant neoplasm of breast: Secondary | ICD-10-CM | POA: Diagnosis not present

## 2022-01-21 DIAGNOSIS — Z9012 Acquired absence of left breast and nipple: Secondary | ICD-10-CM | POA: Diagnosis not present

## 2022-01-21 DIAGNOSIS — K219 Gastro-esophageal reflux disease without esophagitis: Secondary | ICD-10-CM | POA: Diagnosis not present

## 2022-01-21 DIAGNOSIS — J210 Acute bronchiolitis due to respiratory syncytial virus: Secondary | ICD-10-CM | POA: Diagnosis not present

## 2022-01-21 DIAGNOSIS — J440 Chronic obstructive pulmonary disease with acute lower respiratory infection: Secondary | ICD-10-CM | POA: Diagnosis not present

## 2022-01-21 DIAGNOSIS — R296 Repeated falls: Secondary | ICD-10-CM | POA: Diagnosis not present

## 2022-01-21 DIAGNOSIS — I129 Hypertensive chronic kidney disease with stage 1 through stage 4 chronic kidney disease, or unspecified chronic kidney disease: Secondary | ICD-10-CM | POA: Diagnosis not present

## 2022-01-21 DIAGNOSIS — M1990 Unspecified osteoarthritis, unspecified site: Secondary | ICD-10-CM | POA: Diagnosis not present

## 2022-01-21 DIAGNOSIS — J441 Chronic obstructive pulmonary disease with (acute) exacerbation: Secondary | ICD-10-CM | POA: Diagnosis not present

## 2022-01-21 DIAGNOSIS — I700 Atherosclerosis of aorta: Secondary | ICD-10-CM | POA: Diagnosis not present

## 2022-01-21 DIAGNOSIS — Z86718 Personal history of other venous thrombosis and embolism: Secondary | ICD-10-CM | POA: Diagnosis not present

## 2022-01-21 DIAGNOSIS — M810 Age-related osteoporosis without current pathological fracture: Secondary | ICD-10-CM | POA: Diagnosis not present

## 2022-01-21 DIAGNOSIS — Z7901 Long term (current) use of anticoagulants: Secondary | ICD-10-CM | POA: Diagnosis not present

## 2022-01-21 DIAGNOSIS — Z87440 Personal history of urinary (tract) infections: Secondary | ICD-10-CM | POA: Diagnosis not present

## 2022-01-21 DIAGNOSIS — Z9181 History of falling: Secondary | ICD-10-CM | POA: Diagnosis not present

## 2022-01-21 DIAGNOSIS — G4709 Other insomnia: Secondary | ICD-10-CM | POA: Diagnosis not present

## 2022-01-21 DIAGNOSIS — J439 Emphysema, unspecified: Secondary | ICD-10-CM | POA: Diagnosis not present

## 2022-01-21 DIAGNOSIS — N1831 Chronic kidney disease, stage 3a: Secondary | ICD-10-CM | POA: Diagnosis not present

## 2022-01-21 DIAGNOSIS — E782 Mixed hyperlipidemia: Secondary | ICD-10-CM | POA: Diagnosis not present

## 2022-02-21 DIAGNOSIS — H6123 Impacted cerumen, bilateral: Secondary | ICD-10-CM | POA: Diagnosis not present

## 2022-02-21 DIAGNOSIS — H61303 Acquired stenosis of external ear canal, unspecified, bilateral: Secondary | ICD-10-CM | POA: Diagnosis not present

## 2022-02-25 ENCOUNTER — Emergency Department (HOSPITAL_COMMUNITY): Payer: PPO

## 2022-02-25 ENCOUNTER — Encounter (HOSPITAL_COMMUNITY): Payer: Self-pay | Admitting: Family Medicine

## 2022-02-25 ENCOUNTER — Other Ambulatory Visit: Payer: Self-pay

## 2022-02-25 ENCOUNTER — Inpatient Hospital Stay (HOSPITAL_COMMUNITY)
Admission: EM | Admit: 2022-02-25 | Discharge: 2022-03-01 | DRG: 083 | Disposition: A | Discharge status: Home Health | Payer: PPO | Attending: Internal Medicine | Admitting: Internal Medicine

## 2022-02-25 ENCOUNTER — Observation Stay (HOSPITAL_COMMUNITY): Payer: PPO

## 2022-02-25 DIAGNOSIS — D62 Acute posthemorrhagic anemia: Secondary | ICD-10-CM | POA: Diagnosis not present

## 2022-02-25 DIAGNOSIS — E876 Hypokalemia: Secondary | ICD-10-CM | POA: Diagnosis not present

## 2022-02-25 DIAGNOSIS — S0240FA Zygomatic fracture, left side, initial encounter for closed fracture: Secondary | ICD-10-CM | POA: Diagnosis present

## 2022-02-25 DIAGNOSIS — I951 Orthostatic hypotension: Secondary | ICD-10-CM | POA: Diagnosis present

## 2022-02-25 DIAGNOSIS — I609 Nontraumatic subarachnoid hemorrhage, unspecified: Secondary | ICD-10-CM

## 2022-02-25 DIAGNOSIS — S06360A Traumatic hemorrhage of cerebrum, unspecified, without loss of consciousness, initial encounter: Secondary | ICD-10-CM | POA: Diagnosis not present

## 2022-02-25 DIAGNOSIS — R001 Bradycardia, unspecified: Secondary | ICD-10-CM | POA: Diagnosis present

## 2022-02-25 DIAGNOSIS — S066XAA Traumatic subarachnoid hemorrhage with loss of consciousness status unknown, initial encounter: Principal | ICD-10-CM | POA: Diagnosis present

## 2022-02-25 DIAGNOSIS — S52392A Other fracture of shaft of radius, left arm, initial encounter for closed fracture: Secondary | ICD-10-CM | POA: Diagnosis not present

## 2022-02-25 DIAGNOSIS — E785 Hyperlipidemia, unspecified: Secondary | ICD-10-CM | POA: Diagnosis present

## 2022-02-25 DIAGNOSIS — I10 Essential (primary) hypertension: Secondary | ICD-10-CM | POA: Diagnosis present

## 2022-02-25 DIAGNOSIS — S0181XA Laceration without foreign body of other part of head, initial encounter: Secondary | ICD-10-CM | POA: Diagnosis present

## 2022-02-25 DIAGNOSIS — S01112A Laceration without foreign body of left eyelid and periocular area, initial encounter: Secondary | ICD-10-CM | POA: Diagnosis present

## 2022-02-25 DIAGNOSIS — Z803 Family history of malignant neoplasm of breast: Secondary | ICD-10-CM

## 2022-02-25 DIAGNOSIS — R296 Repeated falls: Secondary | ICD-10-CM | POA: Diagnosis present

## 2022-02-25 DIAGNOSIS — Z23 Encounter for immunization: Secondary | ICD-10-CM

## 2022-02-25 DIAGNOSIS — S0232XA Fracture of orbital floor, left side, initial encounter for closed fracture: Secondary | ICD-10-CM | POA: Diagnosis not present

## 2022-02-25 DIAGNOSIS — S066X0A Traumatic subarachnoid hemorrhage without loss of consciousness, initial encounter: Secondary | ICD-10-CM | POA: Diagnosis not present

## 2022-02-25 DIAGNOSIS — W19XXXA Unspecified fall, initial encounter: Secondary | ICD-10-CM

## 2022-02-25 DIAGNOSIS — S02402A Zygomatic fracture, unspecified, initial encounter for closed fracture: Secondary | ICD-10-CM

## 2022-02-25 DIAGNOSIS — Z86718 Personal history of other venous thrombosis and embolism: Secondary | ICD-10-CM

## 2022-02-25 DIAGNOSIS — S0240DA Maxillary fracture, left side, initial encounter for closed fracture: Secondary | ICD-10-CM | POA: Diagnosis present

## 2022-02-25 DIAGNOSIS — S0230XA Fracture of orbital floor, unspecified side, initial encounter for closed fracture: Secondary | ICD-10-CM | POA: Diagnosis not present

## 2022-02-25 DIAGNOSIS — S0990XA Unspecified injury of head, initial encounter: Secondary | ICD-10-CM | POA: Diagnosis not present

## 2022-02-25 DIAGNOSIS — Y92009 Unspecified place in unspecified non-institutional (private) residence as the place of occurrence of the external cause: Secondary | ICD-10-CM | POA: Diagnosis not present

## 2022-02-25 DIAGNOSIS — M19012 Primary osteoarthritis, left shoulder: Secondary | ICD-10-CM | POA: Diagnosis not present

## 2022-02-25 DIAGNOSIS — S020XXA Fracture of vault of skull, initial encounter for closed fracture: Secondary | ICD-10-CM | POA: Diagnosis not present

## 2022-02-25 DIAGNOSIS — K5900 Constipation, unspecified: Secondary | ICD-10-CM | POA: Diagnosis present

## 2022-02-25 DIAGNOSIS — H9190 Unspecified hearing loss, unspecified ear: Secondary | ICD-10-CM | POA: Diagnosis present

## 2022-02-25 DIAGNOSIS — S52615A Nondisplaced fracture of left ulna styloid process, initial encounter for closed fracture: Secondary | ICD-10-CM | POA: Diagnosis present

## 2022-02-25 DIAGNOSIS — S52692A Other fracture of lower end of left ulna, initial encounter for closed fracture: Secondary | ICD-10-CM | POA: Diagnosis not present

## 2022-02-25 DIAGNOSIS — R58 Hemorrhage, not elsewhere classified: Secondary | ICD-10-CM | POA: Diagnosis present

## 2022-02-25 DIAGNOSIS — S3993XA Unspecified injury of pelvis, initial encounter: Secondary | ICD-10-CM | POA: Diagnosis not present

## 2022-02-25 DIAGNOSIS — R4182 Altered mental status, unspecified: Secondary | ICD-10-CM | POA: Diagnosis not present

## 2022-02-25 DIAGNOSIS — S299XXA Unspecified injury of thorax, initial encounter: Secondary | ICD-10-CM | POA: Diagnosis not present

## 2022-02-25 DIAGNOSIS — S3991XA Unspecified injury of abdomen, initial encounter: Secondary | ICD-10-CM | POA: Diagnosis not present

## 2022-02-25 DIAGNOSIS — Z7189 Other specified counseling: Secondary | ICD-10-CM | POA: Diagnosis not present

## 2022-02-25 DIAGNOSIS — S62102A Fracture of unspecified carpal bone, left wrist, initial encounter for closed fracture: Secondary | ICD-10-CM | POA: Diagnosis present

## 2022-02-25 DIAGNOSIS — E871 Hypo-osmolality and hyponatremia: Secondary | ICD-10-CM

## 2022-02-25 DIAGNOSIS — M4319 Spondylolisthesis, multiple sites in spine: Secondary | ICD-10-CM | POA: Diagnosis not present

## 2022-02-25 DIAGNOSIS — K4020 Bilateral inguinal hernia, without obstruction or gangrene, not specified as recurrent: Secondary | ICD-10-CM | POA: Diagnosis not present

## 2022-02-25 DIAGNOSIS — S52502A Unspecified fracture of the lower end of left radius, initial encounter for closed fracture: Secondary | ICD-10-CM | POA: Diagnosis present

## 2022-02-25 DIAGNOSIS — Z66 Do not resuscitate: Secondary | ICD-10-CM | POA: Diagnosis present

## 2022-02-25 DIAGNOSIS — Z888 Allergy status to other drugs, medicaments and biological substances status: Secondary | ICD-10-CM

## 2022-02-25 DIAGNOSIS — W1830XA Fall on same level, unspecified, initial encounter: Secondary | ICD-10-CM | POA: Diagnosis present

## 2022-02-25 DIAGNOSIS — S62102D Fracture of unspecified carpal bone, left wrist, subsequent encounter for fracture with routine healing: Secondary | ICD-10-CM

## 2022-02-25 DIAGNOSIS — Z9221 Personal history of antineoplastic chemotherapy: Secondary | ICD-10-CM | POA: Diagnosis not present

## 2022-02-25 DIAGNOSIS — I2510 Atherosclerotic heart disease of native coronary artery without angina pectoris: Secondary | ICD-10-CM | POA: Diagnosis not present

## 2022-02-25 DIAGNOSIS — Z9012 Acquired absence of left breast and nipple: Secondary | ICD-10-CM | POA: Diagnosis not present

## 2022-02-25 DIAGNOSIS — S0280XA Fracture of other specified skull and facial bones, unspecified side, initial encounter for closed fracture: Secondary | ICD-10-CM | POA: Diagnosis not present

## 2022-02-25 DIAGNOSIS — S4992XA Unspecified injury of left shoulder and upper arm, initial encounter: Secondary | ICD-10-CM | POA: Diagnosis not present

## 2022-02-25 DIAGNOSIS — Z7901 Long term (current) use of anticoagulants: Secondary | ICD-10-CM

## 2022-02-25 DIAGNOSIS — S52292A Other fracture of shaft of left ulna, initial encounter for closed fracture: Secondary | ICD-10-CM | POA: Diagnosis not present

## 2022-02-25 DIAGNOSIS — S02401A Maxillary fracture, unspecified, initial encounter for closed fracture: Secondary | ICD-10-CM

## 2022-02-25 DIAGNOSIS — S065X9A Traumatic subdural hemorrhage with loss of consciousness of unspecified duration, initial encounter: Secondary | ICD-10-CM | POA: Diagnosis not present

## 2022-02-25 DIAGNOSIS — Z515 Encounter for palliative care: Secondary | ICD-10-CM | POA: Diagnosis not present

## 2022-02-25 DIAGNOSIS — Z043 Encounter for examination and observation following other accident: Secondary | ICD-10-CM | POA: Diagnosis not present

## 2022-02-25 DIAGNOSIS — Z79899 Other long term (current) drug therapy: Secondary | ICD-10-CM

## 2022-02-25 DIAGNOSIS — Z602 Problems related to living alone: Secondary | ICD-10-CM | POA: Diagnosis present

## 2022-02-25 DIAGNOSIS — K5730 Diverticulosis of large intestine without perforation or abscess without bleeding: Secondary | ICD-10-CM | POA: Diagnosis not present

## 2022-02-25 DIAGNOSIS — S52592A Other fractures of lower end of left radius, initial encounter for closed fracture: Secondary | ICD-10-CM | POA: Diagnosis not present

## 2022-02-25 DIAGNOSIS — K449 Diaphragmatic hernia without obstruction or gangrene: Secondary | ICD-10-CM | POA: Diagnosis not present

## 2022-02-25 DIAGNOSIS — S2243XA Multiple fractures of ribs, bilateral, initial encounter for closed fracture: Secondary | ICD-10-CM | POA: Diagnosis not present

## 2022-02-25 HISTORY — DX: Hypo-osmolality and hyponatremia: E87.1

## 2022-02-25 LAB — URINALYSIS, ROUTINE W REFLEX MICROSCOPIC
Bacteria, UA: NONE SEEN
Bilirubin Urine: NEGATIVE
Glucose, UA: NEGATIVE mg/dL
Ketones, ur: 5 mg/dL — AB
Leukocytes,Ua: NEGATIVE
Nitrite: NEGATIVE
Protein, ur: NEGATIVE mg/dL
Specific Gravity, Urine: 1.023 (ref 1.005–1.030)
pH: 7 (ref 5.0–8.0)

## 2022-02-25 LAB — PROTIME-INR
INR: 1.4 — ABNORMAL HIGH (ref 0.8–1.2)
Prothrombin Time: 17.2 seconds — ABNORMAL HIGH (ref 11.4–15.2)

## 2022-02-25 LAB — CBC
HCT: 40.2 % (ref 36.0–46.0)
Hemoglobin: 12.9 g/dL (ref 12.0–15.0)
MCH: 28.2 pg (ref 26.0–34.0)
MCHC: 32.1 g/dL (ref 30.0–36.0)
MCV: 87.8 fL (ref 80.0–100.0)
Platelets: 193 10*3/uL (ref 150–400)
RBC: 4.58 MIL/uL (ref 3.87–5.11)
RDW: 15 % (ref 11.5–15.5)
WBC: 15.4 10*3/uL — ABNORMAL HIGH (ref 4.0–10.5)
nRBC: 0 % (ref 0.0–0.2)

## 2022-02-25 LAB — COMPREHENSIVE METABOLIC PANEL
ALT: 16 U/L (ref 0–44)
AST: 39 U/L (ref 15–41)
Albumin: 3.5 g/dL (ref 3.5–5.0)
Alkaline Phosphatase: 72 U/L (ref 38–126)
Anion gap: 11 (ref 5–15)
BUN: 12 mg/dL (ref 8–23)
CO2: 24 mmol/L (ref 22–32)
Calcium: 9 mg/dL (ref 8.9–10.3)
Chloride: 93 mmol/L — ABNORMAL LOW (ref 98–111)
Creatinine, Ser: 0.87 mg/dL (ref 0.44–1.00)
GFR, Estimated: >60 mL/min (ref >60)
Glucose, Bld: 126 mg/dL — ABNORMAL HIGH (ref 70–99)
Potassium: 4.2 mmol/L (ref 3.5–5.1)
Sodium: 128 mmol/L — ABNORMAL LOW (ref 135–145)
Total Bilirubin: 0.9 mg/dL (ref 0.3–1.2)
Total Protein: 6.9 g/dL (ref 6.5–8.1)

## 2022-02-25 LAB — LACTIC ACID, PLASMA
Lactic Acid, Venous: 2 mmol/L (ref 0.5–1.9)
Lactic Acid, Venous: 1.4 mmol/L (ref 0.5–1.9)

## 2022-02-25 LAB — CK: Total CK: 227 U/L (ref 38–234)

## 2022-02-25 LAB — I-STAT CHEM 8, ED
BUN: 14 mg/dL (ref 8–23)
Calcium, Ion: 1.07 mmol/L — ABNORMAL LOW (ref 1.15–1.40)
Chloride: 94 mmol/L — ABNORMAL LOW (ref 98–111)
Creatinine, Ser: 0.7 mg/dL (ref 0.44–1.00)
Glucose, Bld: 124 mg/dL — ABNORMAL HIGH (ref 70–99)
HCT: 42 % (ref 36.0–46.0)
Hemoglobin: 14.3 g/dL (ref 12.0–15.0)
Potassium: 4.1 mmol/L (ref 3.5–5.1)
Sodium: 129 mmol/L — ABNORMAL LOW (ref 135–145)
TCO2: 24 mmol/L (ref 22–32)

## 2022-02-25 LAB — SAMPLE TO BLOOD BANK

## 2022-02-25 MED ORDER — TETANUS-DIPHTH-ACELL PERTUSSIS 5-2.5-18.5 LF-MCG/0.5 IM SUSY
PREFILLED_SYRINGE | INTRAMUSCULAR | Status: AC
  Filled 2022-02-25: qty 0.5

## 2022-02-25 MED ORDER — ACETAMINOPHEN 325 MG PO TABS
650.0000 mg | ORAL_TABLET | Freq: Once | ORAL | Status: AC
  Administered 2022-02-25: 650 mg via ORAL
  Filled 2022-02-25: qty 2

## 2022-02-25 MED ORDER — ACETAMINOPHEN 650 MG RE SUPP: 650.0000 mg | Freq: Four times a day (QID) | RECTAL | Status: DC | PRN

## 2022-02-25 MED ORDER — BISACODYL 5 MG PO TBEC: 5.0000 mg | DELAYED_RELEASE_TABLET | Freq: Every day | ORAL | Status: DC | PRN

## 2022-02-25 MED ORDER — SODIUM CHLORIDE 0.9% FLUSH
3.0000 mL | Freq: Two times a day (BID) | INTRAVENOUS | Status: DC
  Administered 2022-02-26 – 2022-03-01 (×7): 3 mL via INTRAVENOUS

## 2022-02-25 MED ORDER — TETANUS-DIPHTH-ACELL PERTUSSIS 5-2.5-18.5 LF-MCG/0.5 IM SUSY
0.5000 mL | PREFILLED_SYRINGE | Freq: Once | INTRAMUSCULAR | Status: AC
  Administered 2022-02-25: 0.5 mL via INTRAMUSCULAR
  Filled 2022-02-25: qty 0.5

## 2022-02-25 MED ORDER — LISINOPRIL 20 MG PO TABS
40.0000 mg | ORAL_TABLET | Freq: Every day | ORAL | Status: DC
  Administered 2022-02-26 – 2022-03-01 (×4): 40 mg via ORAL
  Filled 2022-02-25 (×4): qty 2

## 2022-02-25 MED ORDER — ACETAMINOPHEN 325 MG PO TABS
650.0000 mg | ORAL_TABLET | Freq: Four times a day (QID) | ORAL | Status: DC | PRN
  Administered 2022-02-26 – 2022-02-28 (×6): 650 mg via ORAL
  Filled 2022-02-25 (×7): qty 2

## 2022-02-25 MED ORDER — POLYETHYLENE GLYCOL 3350 17 G PO PACK: 17.0000 g | PACK | Freq: Every day | ORAL | Status: DC | PRN

## 2022-02-25 MED ORDER — ONDANSETRON HCL 4 MG PO TABS: 4.0000 mg | ORAL_TABLET | Freq: Four times a day (QID) | ORAL | Status: DC | PRN

## 2022-02-25 MED ORDER — IOHEXOL 350 MG/ML SOLN
75.0000 mL | Freq: Once | INTRAVENOUS | Status: AC | PRN
  Administered 2022-02-25: 75 mL via INTRAVENOUS

## 2022-02-25 MED ORDER — AMLODIPINE BESYLATE 5 MG PO TABS
2.5000 mg | ORAL_TABLET | Freq: Every day | ORAL | Status: DC
  Administered 2022-02-26 – 2022-03-01 (×4): 2.5 mg via ORAL
  Filled 2022-02-25 (×4): qty 1

## 2022-02-25 MED ORDER — PANTOPRAZOLE SODIUM 40 MG PO TBEC
40.0000 mg | DELAYED_RELEASE_TABLET | Freq: Every day | ORAL | Status: DC
  Administered 2022-02-26 – 2022-03-01 (×4): 40 mg via ORAL
  Filled 2022-02-25 (×4): qty 1

## 2022-02-25 MED ORDER — METOPROLOL TARTRATE 50 MG PO TABS
50.0000 mg | ORAL_TABLET | Freq: Two times a day (BID) | ORAL | Status: DC
  Administered 2022-02-26 – 2022-02-27 (×3): 50 mg via ORAL
  Filled 2022-02-25 (×3): qty 1

## 2022-02-25 MED ORDER — FENTANYL CITRATE PF 50 MCG/ML IJ SOSY: 12.5000 ug | PREFILLED_SYRINGE | INTRAMUSCULAR | Status: DC | PRN

## 2022-02-25 MED ORDER — LIDOCAINE HCL (PF) 1 % IJ SOLN
5.0000 mL | Freq: Once | INTRAMUSCULAR | Status: AC
  Administered 2022-02-25: 5 mL
  Filled 2022-02-25: qty 5

## 2022-02-25 MED ORDER — SODIUM CHLORIDE 0.9 % IV BOLUS
1000.0000 mL | Freq: Once | INTRAVENOUS | Status: AC
  Administered 2022-02-25: 1000 mL via INTRAVENOUS

## 2022-02-25 MED ORDER — PRAVASTATIN SODIUM 40 MG PO TABS
40.0000 mg | ORAL_TABLET | Freq: Every day | ORAL | Status: DC
  Administered 2022-02-26 – 2022-02-28 (×3): 40 mg via ORAL
  Filled 2022-02-25 (×3): qty 1

## 2022-02-25 MED ORDER — OXYCODONE HCL 5 MG PO TABS
2.5000 mg | ORAL_TABLET | ORAL | Status: DC | PRN
  Filled 2022-02-25: qty 1

## 2022-02-25 MED ORDER — ONDANSETRON HCL 4 MG/2ML IJ SOLN: 4.0000 mg | Freq: Four times a day (QID) | INTRAMUSCULAR | Status: DC | PRN

## 2022-02-25 NOTE — ED Notes (Signed)
Patient transported to CT with primary RN.

## 2022-02-25 NOTE — Progress Notes (Signed)
   02/25/22 1803  Spiritual Encounters  Type of Visit Initial  Care provided to: Family  Conversation partners present during encounter Nurse  Referral source Code page  Reason for visit Routine spiritual support  OnCall Visit Yes  Spiritual Framework  Presenting Themes Other (comment)  Community/Connection Family  Strengths Family Support System  Patient Stress Factors None identified  Family Stress Factors None identified   Responded to trauma alert level 2. Patient brought in followed by 2 relatives, a third relative arrived as was advised 2 more are on the way. Patient going for CT etc.. Escorted family to consult room where nurse advised she would let them know when patient is finish testing.

## 2022-02-25 NOTE — ED Provider Notes (Signed)
[facility] Provider Note   CSN: [id number] Arrival date & time: 02/25/22  1804     History  Chief Complaint  Patient presents with   fall on thinners   level 2    Stephanie Pearson is a 89 y.o. female.  Is a 89 year old female with a past medical history of VTE on Xarelto, hypertension, hyperlipidemia presenting to the emergency department after a fall.  Patient states that she was unsure how she fell today.  Patient's granddaughter states that she spoke to her on the phone around 10:30 AM this morning and she sounded normal and then called her around 3 PM and she did not answer so she drove over to her house and found her on the ground.  The patient states that she thinks that she fell around noon today and was unable to get up.  She states that she has facial pain, headache and left shoulder pain.  She states that she was feeling well prior to today without any chest pain, shortness of breath, lightheadedness or dizziness and denies any recent nausea, vomiting or diarrhea.  She states she has been eating and drinking normally.  The history is provided by the patient, the EMS personnel and a relative.       Home Medications Prior to Admission medications   Medication Sig Start Date End Date Taking? Authorizing Provider  amLODipine (NORVASC) 2.5 MG tablet  05/09/13   [provider]  fosinopril (MONOPRIL) 40 MG tablet  06/16/13   [provider]  lovastatin (MEVACOR) 40 MG tablet  05/12/13   [provider]  metoprolol (LOPRESSOR) 50 MG tablet  05/30/13   [provider]  pantoprazole (PROTONIX) 40 MG tablet  04/07/13   [provider]  XARELTO 20 MG TABS tablet  05/20/13   [provider]      Allergies    Prilosec [omeprazole]    Review of Systems   Review of Systems  Physical Exam Updated Vital Signs BP (!) 109/95   Pulse 83   Temp (!) 97 F (36.1 C) (Temporal)   Resp 16   Ht  5' 5"$  (1.651 m)   Wt 59.7 kg   SpO2 100%   BMI 21.90 kg/m  Physical Exam Vitals and nursing note reviewed.  Constitutional:      General: She is not in acute distress.    Appearance: Normal appearance.  HENT:     Head: Normocephalic.     Nose: Nose normal.     Comments: No septal hematoma bilaterally    Mouth/Throat:     Mouth: Mucous membranes are moist.     Pharynx: Oropharynx is clear.  Eyes:     Extraocular Movements: Extraocular movements intact.     Pupils: Pupils are equal, round, and reactive to light.     Comments: Some conjunctival hemorrhage on the left with chemosis, no proptosis, vision grossly intact Significant left periorbital swelling and bruising with tenderness to palpation  Neck:     Comments: No midline neck tenderness Cardiovascular:     Rate and Rhythm: Normal rate and regular rhythm.     Pulses: Normal pulses.     Heart sounds: Normal heart sounds.  Pulmonary:     Effort: Pulmonary effort is normal.     Breath sounds: Normal breath sounds.  Abdominal:     General: Abdomen is flat.     Palpations: Abdomen is soft.     Tenderness: There is  no abdominal tenderness.  Musculoskeletal:        General: Normal range of motion.     Cervical back: Normal range of motion and neck supple.     Comments: Tenderness to palpation of left shoulder with overlying bruise, no obvious deformity, no tenderness to palpation of right upper extremity or bilateral lower extremities No midline back tenderness Pelvis stable, nontender  Skin:    General: Skin is warm and dry.     Findings: Bruising present.     Comments: ~1.5 cm actively bleeding laceration without pulsatile bleeding to L eyebrow  Neurological:     General: No focal deficit present.     Mental Status: She is alert and oriented to person, place, and time.     Sensory: No sensory deficit.     Motor: No weakness.  Psychiatric:        Mood and Affect: Mood normal.        Behavior: Behavior normal.      ED Results / Procedures / Treatments   Labs (all labs ordered are listed, but only abnormal results are displayed) Labs Reviewed  COMPREHENSIVE METABOLIC PANEL - Abnormal; Notable for the following components:      Result Value   Sodium 128 (*)    Chloride 93 (*)    Glucose, Bld 126 (*)    All other components within normal limits  CBC - Abnormal; Notable for the following components:   WBC 15.4 (*)    All other components within normal limits  URINALYSIS, ROUTINE W REFLEX MICROSCOPIC - Abnormal; Notable for the following components:   Color, Urine STRAW (*)    Hgb urine dipstick SMALL (*)    Ketones, ur 5 (*)    All other components within normal limits  LACTIC ACID, PLASMA - Abnormal; Notable for the following components:   Lactic Acid, Venous 2.0 (*)    All other components within normal limits  PROTIME-INR - Abnormal; Notable for the following components:   Prothrombin Time 17.2 (*)    INR 1.4 (*)    All other components within normal limits  I-STAT CHEM 8, ED - Abnormal; Notable for the following components:   Sodium 129 (*)    Chloride 94 (*)    Glucose, Bld 124 (*)    Calcium, Ion 1.07 (*)    All other components within normal limits  CK  LACTIC ACID, PLASMA  ETHANOL  LACTIC ACID, PLASMA  SAMPLE TO BLOOD BANK    EKG None  Radiology CT MAXILLOFACIAL WO CONTRAST  Result Date: 02/25/2022 CLINICAL DATA:  Initial evaluation for acute trauma, fall. EXAM: CT MAXILLOFACIAL WITHOUT CONTRAST TECHNIQUE: Multidetector CT imaging of the maxillofacial structures was performed. Multiplanar CT image reconstructions were also generated. RADIATION DOSE REDUCTION: This exam was performed according to the departmental dose-optimization program which includes automated exposure control, adjustment of the mA and/or kV according to patient size and/or use of iterative reconstruction technique. COMPARISON:  None Available. FINDINGS: Osseous: Subtle lucency extends through the left  cycle medic arch (series 6, image 31), somewhat age indeterminate, but presumably acute. There are acute minimally displaced fractures extending through the posterior wall of the left maxillary sinus. Additional acute nondisplaced fracture extends through the anterior wall. Associated fractures of the left orbital floor with up to 2 mm of depression (series 9, image 31). Comminuted and mildly angulated fractures of the lateral left orbital wall noted as well. Left lamina per appreciate intact. No acute osseous abnormality about the contralateral right  orbit. Nasal bones intact. Right-to-left nasal septal deviation without fracture. Mandible intact. Mandibular condyles normally situated. No acute abnormality about the dentition. Orbits: Fractures involving the left orbital floor and lateral left orbital wall as above. Small amount of hemorrhage present within the extraconal fat of the inferior left orbit. Globes intact. Prior ocular lens replacement. Sinuses: Hemosinus within the left maxillary sinus. Scattered mucosal thickening present within the sphenoid ethmoidal sinuses. Moderate left mastoid effusion, partially visualized. Soft tissues: Acute left periorbital and facial contusion. No frank hematoma. Limited intracranial: Small focus of acute hemorrhage noted at the anterior/inferior left frontal lobe (series 4, image 71). Finding better characterized on corresponding head CT. IMPRESSION: 1. Acute left facial tripod fracture as detailed above. 2. Small volume hemorrhage within the inferior bony left orbit. No retro-orbital hematoma. Globes intact. 3. Overlying acute left periorbital and facial contusion. 4. Small focus of acute hemorrhage at the anterior/inferior left frontal lobe, better characterized on corresponding head CT. Electronically Signed   By: Jeannine Boga M.D.   On: 02/25/2022 19:52   CT CHEST ABDOMEN PELVIS W CONTRAST  Result Date: 02/25/2022 CLINICAL DATA:  Polytrauma, blunt [id number]  Trauma [id number] EXAM: CT CHEST, ABDOMEN, AND PELVIS WITH CONTRAST TECHNIQUE: Multidetector CT imaging of the chest, abdomen and pelvis was performed following the standard protocol during bolus administration of intravenous contrast. RADIATION DOSE REDUCTION: This exam was performed according to the departmental dose-optimization program which includes automated exposure control, adjustment of the mA and/or kV according to patient size and/or use of iterative reconstruction technique. CONTRAST:  67m OMNIPAQUE IOHEXOL 350 MG/ML SOLN COMPARISON:  CT abdomen pelvis 02/16/2004 FINDINGS: CHEST: Cardiovascular: No aortic injury. The thoracic aorta is normal in caliber. The heart is normal in size. No significant pericardial effusion. Likely mitral annular calcifications. Aortic valve leaflet calcifications. Moderate atherosclerotic plaque of the aorta. Left anterior descending coronary calcification. The main pulmonary artery is normal in caliber. No central pulmonary embolus. Mediastinum/Nodes: No pneumomediastinum. No mediastinal hematoma. The esophagus is unremarkable.  Small hiatal hernia. Subcentimeter thyroid hypodense nodules. Not clinically significant; no follow-up imaging recommended (ref: J Am Coll Radiol. 2015 Feb;12(2): 143-50). The central airways are patent. No mediastinal, hilar, or axillary lymphadenopathy. Lungs/Pleura: No focal consolidation. No pulmonary nodule. No pulmonary mass. No pulmonary contusion or laceration. No pneumatocele formation. No pleural effusion. No pneumothorax. No hemothorax. Musculoskeletal/Chest wall: No chest wall mass.  Left mastectomy. Diffusely decreased bone density. No acute rib or sternal fracture. Old healed bilateral rib fractures. No spinal fracture. Rotator cuff anchor suture along the left humeral head. Severe degenerative changes of the bilateral shoulders. ABDOMEN / PELVIS: Hepatobiliary: Not enlarged. No focal lesion. No laceration or subcapsular hematoma. Status  post cholecystectomy.  Pneumobilia. Pancreas: Normal pancreatic contour. No main pancreatic duct dilatation. Spleen: Not enlarged. No focal lesion. No laceration, subcapsular hematoma, or vascular injury. Adrenals/Urinary Tract: No nodularity bilaterally. Bilateral kidneys enhance symmetrically. No hydronephrosis. No contusion, laceration, or subcapsular hematoma. Subcentimeter hypodensities too small to characterize-no further follow-up indicated. No injury to the vascular structures or collecting systems. No hydroureter. The urinary bladder is unremarkable. Stomach/Bowel: No small or large bowel wall thickening or dilatation. Diffuse sigmoid diverticulosis. Otherwise scattered colonic diverticulosis. The appendix is not definitely identified with no inflammatory changes in the right lower quadrant to suggest acute appendicitis. Vasculature/Lymphatics: No abdominal aorta or iliac aneurysm. No active contrast extravasation or pseudoaneurysm. No abdominal, pelvic, inguinal lymphadenopathy. Reproductive: Normal. Other: No simple free fluid ascites. No pneumoperitoneum. No hemoperitoneum. No mesenteric hematoma identified. No organized  fluid collection. Musculoskeletal: No significant soft tissue hematoma. Bilateral small fat containing inguinal hernias. Diffusely decreased bone density. No acute pelvic fracture. Old healed right hip fracture. No spinal fracture. Grade 2 anterolisthesis of L5 on S1. Grade 1 anterolisthesis of L4 on L5. Ports and Devices: None. IMPRESSION: 1. No acute traumatic injury to the chest, abdomen, or pelvis. 2. No acute fracture or traumatic malalignment of the thoracic or lumbar spine. 3. Other imaging findings of potential clinical significance: Colonic diverticulosis with no acute diverticulitis. Bilateral fat containing inguinal hernias. Aortic Atherosclerosis (ICD10-I70.0) including likely mitral annular, coronary artery, as well as aortic valve leaflet calcifications-correlate for aortic  stenosis. Electronically Signed   By: Iven Finn M.D.   On: 02/25/2022 19:51   CT CERVICAL SPINE WO CONTRAST  Result Date: 02/25/2022 CLINICAL DATA:  Unwitnessed fall EXAM: CT CERVICAL SPINE WITHOUT CONTRAST TECHNIQUE: Multidetector CT imaging of the cervical spine was performed without intravenous contrast. Multiplanar CT image reconstructions were also generated. RADIATION DOSE REDUCTION: This exam was performed according to the departmental dose-optimization program which includes automated exposure control, adjustment of the mA and/or kV according to patient size and/or use of iterative reconstruction technique. COMPARISON:  None Available. FINDINGS: Alignment: Normal. Skull base and vertebrae: Craniocervical alignment is normal. The atlantodental interval is not widened. No acute fracture of the cervical spine. No lytic or blastic bone lesion. Soft tissues and spinal canal: No prevertebral fluid or swelling. No visible canal hematoma. There is extensive atherosclerotic calcification within the carotid bifurcations bilaterally. Disc levels: There is intervertebral disc space narrowing and endplate remodeling throughout the cervical spine, most severe at C5-C7 in keeping with changes of mild to moderate degenerative disc disease. Prevertebral soft tissues are not thickened on sagittal reformats. Spinal canal is widely patent. Multilevel uncovertebral and facet arthrosis results in mild multilevel neuroforaminal narrowing, most severe at C5-C7. Upper chest: Negative. Other: None IMPRESSION: 1. No acute fracture or listhesis of the cervical spine. 2. Extensive atherosclerotic calcification within the carotid bifurcations bilaterally. If indicated, the degree of stenosis would be better assessed with dedicated carotid artery Doppler sonography. Electronically Signed   By: Fidela Salisbury M.D.   On: 02/25/2022 19:42   CT HEAD WO CONTRAST  Result Date: 02/25/2022 CLINICAL DATA:  Trauma EXAM: CT HEAD  WITHOUT CONTRAST TECHNIQUE: Contiguous axial images were obtained from the base of the skull through the vertex without intravenous contrast. RADIATION DOSE REDUCTION: This exam was performed according to the departmental dose-optimization program which includes automated exposure control, adjustment of the mA and/or kV according to patient size and/or use of iterative reconstruction technique. COMPARISON:  None Available. FINDINGS: Brain: There is a very small amount of subarachnoid hemorrhage in the anterior left frontal lobe image 4/16 and 4/20 there is no mass effect, midline shift or extra-axial fluid collection. There is no acute infarct. There is mild diffuse atrophy. There is moderate periventricular and deep white matter hypodensity, likely chronic small vessel ischemic change. Vascular: Atherosclerotic calcifications are present within the cavernous internal carotid arteries. Skull: Negative for skull fracture. Fractures of the left maxilla and left orbit noted. Please see dedicated CT maxillofacial for further description. Sinuses/Orbits: There is hemorrhage/air-fluid level in the left maxillary sinus. Bilateral mastoid effusions are present. Other: Left periorbital swelling. IMPRESSION: 1. Very small amount of subarachnoid hemorrhage in the anterior left frontal lobe. No mass effect or midline shift. 2. Atrophy and chronic small vessel ischemic changes. 3. Fractures of the left maxilla and left orbit. Please see dedicated CT  maxillofacial for further description. Electronically Signed   By: Ronney Asters M.D.   On: 02/25/2022 19:39   DG Humerus Left  Result Date: 02/25/2022 CLINICAL DATA:  Trauma to the left upper extremity. EXAM: LEFT SHOULDER; LEFT HUMERUS - 2+ VIEW COMPARISON:  Chest radiograph dated 02/25/2022. FINDINGS: No acute fracture or dislocation of the left shoulder. The bones are osteopenic. There is degenerative changes with calcific tendinopathy of the supraspinatus muscle. Humeral  head anchor pins noted. Multiple old appearing left rib fractures. Several surgical clips noted over the left axilla. IMPRESSION: 1. No acute fracture or dislocation of the left shoulder or left humerus. 2. Osteopenia and degenerative changes. Electronically Signed   By: Anner Crete M.D.   On: 02/25/2022 18:52   DG Shoulder Left Port  Result Date: 02/25/2022 CLINICAL DATA:  Trauma to the left upper extremity. EXAM: LEFT SHOULDER; LEFT HUMERUS - 2+ VIEW COMPARISON:  Chest radiograph dated 02/25/2022. FINDINGS: No acute fracture or dislocation of the left shoulder. The bones are osteopenic. There is degenerative changes with calcific tendinopathy of the supraspinatus muscle. Humeral head anchor pins noted. Multiple old appearing left rib fractures. Several surgical clips noted over the left axilla. IMPRESSION: 1. No acute fracture or dislocation of the left shoulder or left humerus. 2. Osteopenia and degenerative changes. Electronically Signed   By: Anner Crete M.D.   On: 02/25/2022 18:52   DG Chest Port 1 View  Result Date: 02/25/2022 CLINICAL DATA:  Trauma EXAM: PORTABLE CHEST 1 VIEW COMPARISON:  12/20/2021, 12/13/2021 FINDINGS: Heart size is stable. No focal airspace consolidation, pleural effusion, or pneumothorax. Multiple chronic bilateral rib fractures. No definite acute rib fracture is seen. IMPRESSION: 1. No acute findings within the chest. 2. Multiple chronic bilateral rib fractures. No definite acute rib fracture is seen. Electronically Signed   By: Davina Poke D.O.   On: 02/25/2022 18:36   DG Pelvis Portable  Result Date: 02/25/2022 CLINICAL DATA:  Trauma. EXAM: PORTABLE PELVIS 1-2 VIEWS COMPARISON:  None Available. FINDINGS: No fracture.  No bone lesion. Hip joints, SI joints and pubic symphysis are normally aligned. Skeletal structures are demineralized. Soft tissues are unremarkable. IMPRESSION: No fracture or acute finding. Electronically Signed   By: Lajean Manes M.D.   On:  02/25/2022 18:36    Procedures .Critical Care  Performed by: Kemper Durie, DO Authorized by: Kemper Durie, DO   Critical care provider statement:    Critical care time (minutes):  40   Critical care was necessary to treat or prevent imminent or life-threatening deterioration of the following conditions:  CNS failure or compromise   Critical care was time spent personally by me on the following activities:  Development of treatment plan with patient or surrogate, discussions with consultants, evaluation of patient's response to treatment, examination of patient, ordering and performing treatments and interventions, ordering and review of laboratory studies, ordering and review of radiographic studies, pulse oximetry, re-evaluation of patient's condition, review of old charts and obtaining history from patient or surrogate   I assumed direction of critical care for this patient from another provider in my specialty: no     Care discussed with: admitting provider   .Marland KitchenLaceration Repair  Date/Time: 02/25/2022 9:51 PM  Performed by: Kemper Durie, DO Authorized by: Kemper Durie, DO   Consent:    Consent obtained:  Verbal   Consent given by:  Patient   Risks, benefits, and alternatives were discussed: yes     Risks discussed:  Infection,  pain, need for additional repair, poor cosmetic result, nerve damage and poor wound healing   Alternatives discussed:  No treatment and delayed treatment Universal protocol:    Procedure explained and questions answered to patient or proxy's satisfaction: yes     Patient identity confirmed:  Verbally with patient Anesthesia:    Anesthesia method:  Local infiltration   Local anesthetic:  Lidocaine 1% w/o epi Laceration details:    Location:  Face   Face location:  L eyebrow   Length (cm):  1.5   Depth (mm):  2 Pre-procedure details:    Preparation:  Imaging obtained to evaluate for foreign bodies Exploration:    Limited  defect created (wound extended): yes     Hemostasis achieved with:  Direct pressure   Imaging obtained comment:  CT max/face   Imaging outcome: foreign body not noted     Wound exploration: wound explored through full range of motion     Wound extent: areolar tissue not violated, fascia not violated, no foreign body, no signs of injury, no nerve damage, no tendon damage, no underlying fracture and no vascular damage     Contaminated: no   Treatment:    Area cleansed with:  Saline   Amount of cleaning:  Standard   Irrigation solution:  Sterile water   Visualized foreign bodies/material removed: no     Debridement:  None   Undermining:  None   Scar revision: no   Skin repair:    Repair method:  Sutures   Suture size:  5-0   Suture material:  Prolene   Suture technique:  Simple interrupted   Number of sutures:  2 Approximation:    Approximation:  Close Repair type:    Repair type:  Simple Post-procedure details:    Dressing:  Open (no dressing)   Procedure completion:  Tolerated well, no immediate complications     Medications Ordered in ED Medications  lidocaine (PF) (XYLOCAINE) 1 % injection 5 mL (has no administration in time range)  Tdap (BOOSTRIX) 5-2.5-18.5 LF-MCG/0.5 injection ( Intramuscular Canceled Entry 02/25/22 1902)  Tdap (BOOSTRIX) 5-2.5-18.5 LF-MCG/0.5 injection ( Intramuscular Canceled Entry 02/25/22 1903)  acetaminophen (TYLENOL) tablet 650 mg (has no administration in time range)  sodium chloride 0.9 % bolus 1,000 mL (0 mLs Intravenous Stopped 02/25/22 2131)  Tdap (BOOSTRIX) injection 0.5 mL (0.5 mLs Intramuscular Given 02/25/22 1904)  iohexol (OMNIPAQUE) 350 MG/ML injection 75 mL (75 mLs Intravenous Contrast Given 02/25/22 1932)    ED Course/ Medical Decision Making/ A&P Clinical Course as of 02/25/22 2215  Sat Feb 25, 2022  2101 CT imaging shows a small left subarachnoid hemorrhage.  Spoke with Glenford Peers, NP of neurosurgery who states that the patient  does not need anticoagulation reversal.  She will review her imaging but agrees with admission for observation.  Upon reassessment, the patient states that she thinks that she remembers prior to the fall that she was feeling up Gatorade and dropped the Gatorade bottle and may have slipped causing her to fall.  Patient's CT imaging also showed evidence of a left tripod facial fracture.  Ophthalmology was consulted and is pending callback at this time. [VK]  2149 I spoke with Dr. Lucianne Lei of ophtho who recommended pain control, limiting nose blowing and following up as an outpatient in the clinic next week [VK]  2156 I spoke with Dr. Redmond Pulling of trauma who recommended medicine admission for observation and they can follow as an inpatient consult as needed. [VK]  Clinical Course User Index [VK] Kemper Durie, DO                             Medical Decision Making This patient presents to the ED with chief complaint(s) of fall with pertinent past medical history of VTE on Xarelto, HTN which further complicates the presenting complaint. The complaint involves an extensive differential diagnosis and also carries with it a high risk of complications and morbidity.    The differential diagnosis includes ICH, mass effect, cervical spine fracture, facial fracture, the patient has no orbital proptosis or vision changes making a retrobulbar hematoma less likely, shoulder fracture, because the patient does not remember the fall concerning for syncopal fall secondary to arrhythmia, anemia, electrolyte abnormality, possible rhabdo with prolonged downtime  Additional history obtained: Additional history obtained from family and EMS  Records reviewed Primary Care Documents  ED Course and Reassessment: Due to patient's fall on thinners she was made a prehospital arrival level 2 trauma.  I was immediately present at bedside upon the patient's arrival.  Her primary survey was intact.  Secondary survey was  significant for left periorbital contusion and swelling with subconjunctival hemorrhage and chemosis, however vision was intact and she had no proptosis and no ocular pain making a retrobulbar hematoma unlikely.  She also had bruising and tenderness to palpation of the left shoulder.  Patient will undergo trauma evaluation with x-ray and CT scan as well as workup with EKG and labs to evaluate for possible syncopal fall and she will be closely reassessed.  Independent labs interpretation:  The following labs were independently interpreted: Mild hyponatremia, otherwise within normal range  Independent visualization of imaging: - I independently visualized the following imaging with scope of interpretation limited to determining acute life threatening conditions related to emergency care: CT pan scan, chest x-ray, left shoulder x-ray, pelvis x-ray, which revealed small left subarachnoid hemorrhage, left facial tripod fracture, no other acute traumatic injuries  Consultation: - Consulted or discussed management/test interpretation w/ external professional: Neurosurgery, ophthalmology, trauma, hospitalist  Consideration for admission or further workup: Patient requires admission for neurologic monitoring and observation in the setting of her subarachnoid hemorrhage Social Determinants of health: N/A    Amount and/or Complexity of Data Reviewed Labs: ordered. Radiology: ordered.  Risk Prescription drug management. Decision regarding hospitalization.          Final Clinical Impression(s) / ED Diagnoses Final diagnoses:  Closed tripod fracture of left zygomaticomaxillary complex ([place])  SAH (subarachnoid hemorrhage) ([place])  Fall, initial encounter  Laceration of left eyebrow, initial encounter    Rx / DC Orders ED Discharge Orders     None         Kemper Durie, DO 02/25/22 2215

## 2022-02-25 NOTE — ED Triage Notes (Signed)
Pt BIB EMS as Level 2 fall on thinners. Granddaughter last spoke to pt at 67 & then found her on the floor laying on Lt side 5 hrs later, takes Xarelto, Hx of Lt Breast CA. GCS 15, A/Ox4, Lt eye swollen shut & purple, a small lac above that eye brow still actively trickling blood, Bruising & reported pain to Lt arm/shoulder as well. CBG 135, 110 bpm, 147/98, 97% on RA.

## 2022-02-25 NOTE — Consult Note (Signed)
CC: I fell  Requesting provider: Dr Maylon Peppers  HPI: Stephanie Pearson is an 89 y.o. female who is here for evaluation as a level 2 trauma.  She was brought in by EMS as a ground-level fall.  She is on Xarelto.  Reportedly the granddaughter last spoke with the patient at 1030 this morning and then family found her on the floor laying on the left side 5 hours later.  She was noted to had significant swelling around her left eye, small laceration above the left eyebrow and bruising around the left arm and wrist.  She is on Xarelto for history of DVT, also has a history of hypertension and hyponatremia.  Her primary complaint right now is left wrist pain.  Past Medical History:  Diagnosis Date   Cancer [facility])    History of blood clots    Hypertension    Hyponatremia 02/25/2022   Personal history of chemotherapy 1999    Past Surgical History:  Procedure Laterality Date   ABDOMINAL HYSTERECTOMY     BREAST EXCISIONAL BIOPSY Right 2002   BREAST EXCISIONAL BIOPSY Bilateral 05/08/1997   BREAST SURGERY     EYE SURGERY     MASTECTOMY Left 1999   ROTATOR CUFF REPAIR     spot removed from lung      Family History  Problem Relation Age of Onset   Breast cancer Sister     Social:  reports that she has never smoked. She has never used smokeless tobacco. She reports that she does not drink alcohol and does not use drugs.  Allergies:  Allergies  Allergen Reactions   Prilosec [Omeprazole] Rash    Medications: I have reviewed the patient's current medications.   ROS - all of the below systems have been reviewed with the patient and positives are indicated with bold text General: chills, fever or night sweats Eyes: blurry vision or double vision ENT: epistaxis or sore throat Allergy/Immunology: itchy/watery eyes or nasal congestion Hematologic/Lymphatic: bleeding problems, blood clots or swollen lymph nodes Endocrine: temperature intolerance or unexpected weight changes Breast: new or  changing breast lumps or nipple discharge Resp: cough, shortness of breath, or wheezing CV: chest pain or dyspnea on exertion GI: as per HPI GU: dysuria, trouble voiding, or hematuria MSK: joint pain or joint stiffness Neuro: TIA or stroke symptoms Derm: pruritus and skin lesion changes Psych: anxiety and depression  PE Blood pressure 128/62, pulse 79, temperature (!) 97 F (36.1 C), temperature source Temporal, resp. rate 15, height 5' 5"$  (1.651 m), weight 59.7 kg, SpO2 98 %. Constitutional: NAD; conversant; no deformities Eyes: Moist conjunctiva; no lid lag; anicteric; significant left periorbital ecchymosis and swelling.  Also involving eyelid.  Very difficult to raise eyelid to examine the eye. Neck: Trachea midline; no thyromegaly Lungs: Normal respiratory effort; no tactile fremitus CV: RRR; no palpable thrills; no pitting edema GI: Abd soft, nontender, nondistended; no palpable hepatosplenomegaly MSK: no clubbing/cyanosis; right upper, bilateral lower extremities-no palpable deformities.  Free range of motion.  Left upper extremity-there is subtle swelling and tenderness around her left distal radius and at the wrist.  2+ radial pulses bilaterally.  There is some subtle bruising along the left proximal upper extremity anterior medially Psychiatric: Appropriate affect; alert and oriented x3 Lymphatic: No palpable cervical or axillary lymphadenopathy Skin: Left periorbital swelling and bruising, some swelling around the left wrist; dried blood in left scalp; small laceration above left eye  Results for orders placed or performed during the hospital encounter of 02/25/22 (from  the past 48 hour(s))  Comprehensive metabolic panel     Status: Abnormal   Collection Time: 02/25/22  6:17 PM  Result Value Ref Range   Sodium 128 (L) 135 - 145 mmol/L   Potassium 4.2 3.5 - 5.1 mmol/L   Chloride 93 (L) 98 - 111 mmol/L   CO2 24 22 - 32 mmol/L   Glucose, Bld 126 (H) 70 - 99 mg/dL    Comment:  Glucose reference range applies only to samples taken after fasting for at least 8 hours.   BUN 12 8 - 23 mg/dL   Creatinine, Ser 0.87 0.44 - 1.00 mg/dL   Calcium 9.0 8.9 - 10.3 mg/dL   Total Protein 6.9 6.5 - 8.1 g/dL   Albumin 3.5 3.5 - 5.0 g/dL   AST 39 15 - 41 U/L   ALT 16 0 - 44 U/L   Alkaline Phosphatase 72 38 - 126 U/L   Total Bilirubin 0.9 0.3 - 1.2 mg/dL   GFR, Estimated >60 >60 mL/min    Comment: (NOTE) Calculated using the CKD-EPI Creatinine Equation (2021)    Anion gap 11 5 - 15    Comment: Performed at [facility] [address], [place], [place] [address]  CBC     Status: Abnormal   Collection Time: 02/25/22  6:17 PM  Result Value Ref Range   WBC 15.4 (H) 4.0 - 10.5 K/uL   RBC 4.58 3.87 - 5.11 MIL/uL   Hemoglobin 12.9 12.0 - 15.0 g/dL   HCT 40.2 36.0 - 46.0 %   MCV 87.8 80.0 - 100.0 fL   MCH 28.2 26.0 - 34.0 pg   MCHC 32.1 30.0 - 36.0 g/dL   RDW 15.0 11.5 - 15.5 %   Platelets 193 150 - 400 K/uL   nRBC 0.0 0.0 - 0.2 %    Comment: Performed at [facility], [place] [address], [place], [place] [address]  Lactic acid, plasma     Status: Abnormal   Collection Time: 02/25/22  6:17 PM  Result Value Ref Range   Lactic Acid, Venous 2.0 (HH) 0.5 - 1.9 mmol/L    Comment: CRITICAL RESULT CALLED TO, READ BACK BY AND VERIFIED WITH R,SMITH RN @1931$  02/25/22 E,BENTON Performed at [facility], [place] [address], [place], [place] [address]   Protime-INR     Status: Abnormal   Collection Time: 02/25/22  6:17 PM  Result Value Ref Range   Prothrombin Time 17.2 (H) 11.4 - 15.2 seconds   INR 1.4 (H) 0.8 - 1.2    Comment: (NOTE) INR goal varies based on device and disease states. Performed at [facility], [place] [address], [place], [place] [address]   CK     Status: None   Collection Time: 02/25/22  6:17 PM  Result Value Ref Range   Total CK 227 38 - 234 U/L    Comment: Performed at [facility], [place] [address] [place], [place] [address]  Sample to  Blood Bank     Status: None   Collection Time: 02/25/22  6:20 PM  Result Value Ref Range   Blood Bank Specimen SAMPLE AVAILABLE FOR TESTING    Sample Expiration      02/26/2022,2359 Performed at [facility], [place] [address], [place], [place] [address]   I-Stat Chem 8, ED     Status: Abnormal   Collection Time: 02/25/22  6:32 PM  Result Value Ref Range   Sodium 129 (L)  135 - 145 mmol/L   Potassium 4.1 3.5 - 5.1 mmol/L   Chloride 94 (L) 98 - 111 mmol/L   BUN 14 8 - 23 mg/dL   Creatinine, Ser 0.70 0.44 - 1.00 mg/dL   Glucose, Bld 124 (H) 70 - 99 mg/dL    Comment: Glucose reference range applies only to samples taken after fasting for at least 8 hours.   Calcium, Ion 1.07 (L) 1.15 - 1.40 mmol/L   TCO2 24 22 - 32 mmol/L   Hemoglobin 14.3 12.0 - 15.0 g/dL   HCT 42.0 36.0 - 46.0 %  Urinalysis, Routine w reflex microscopic -Urine, Clean Catch     Status: Abnormal   Collection Time: 02/25/22  9:20 PM  Result Value Ref Range   Color, Urine STRAW (A) YELLOW   APPearance CLEAR CLEAR   Specific Gravity, Urine 1.023 1.005 - 1.030   pH 7.0 5.0 - 8.0   Glucose, UA NEGATIVE NEGATIVE mg/dL   Hgb urine dipstick SMALL (A) NEGATIVE   Bilirubin Urine NEGATIVE NEGATIVE   Ketones, ur 5 (A) NEGATIVE mg/dL   Protein, ur NEGATIVE NEGATIVE mg/dL   Nitrite NEGATIVE NEGATIVE   Leukocytes,Ua NEGATIVE NEGATIVE   RBC / HPF 0-5 0 - 5 RBC/hpf   WBC, UA 0-5 0 - 5 WBC/hpf   Bacteria, UA NONE SEEN NONE SEEN   Squamous Epithelial / HPF 0-5 0 - 5 /HPF    Comment: Performed at [facility], [address] [place], [place] [address]  Lactic acid, plasma     Status: None   Collection Time: 02/25/22  9:20 PM  Result Value Ref Range   Lactic Acid, Venous 1.4 0.5 - 1.9 mmol/L    Comment: Performed at [facility] [address], [place], [place] [address]    DG Wrist Complete Left  Result Date: 02/25/2022 CLINICAL DATA:  Fall EXAM: LEFT WRIST - COMPLETE 3+ VIEW COMPARISON:  None Available.  FINDINGS: The bones are osteopenic. There is an acute transverse fracture through the distal radius, mildly impacted. There is 7 mm of posterior offset along the posterior aspect of the fracture. There is an acute nondisplaced fracture through the distal ulna which is nondisplaced. Nondisplaced ulnar styloid fracture is present. There are degenerative changes of the wrist with radiocarpal and ulnocarpal joint space narrowing and calcification of the triangular fibrocartilage. There is soft tissue swelling surrounding the wrist. IMPRESSION: 1. Acute transverse fracture through the distal radius. 2. Acute nondisplaced fracture through the distal ulna. 3. Nondisplaced ulnar styloid fracture. Electronically Signed   By: Ronney Asters M.D.   On: 02/25/2022 23:14   CT MAXILLOFACIAL WO CONTRAST  Result Date: 02/25/2022 CLINICAL DATA:  Initial evaluation for acute trauma, fall. EXAM: CT MAXILLOFACIAL WITHOUT CONTRAST TECHNIQUE: Multidetector CT imaging of the maxillofacial structures was performed. Multiplanar CT image reconstructions were also generated. RADIATION DOSE REDUCTION: This exam was performed according to the departmental dose-optimization program which includes automated exposure control, adjustment of the mA and/or kV according to patient size and/or use of iterative reconstruction technique. COMPARISON:  None Available. FINDINGS: Osseous: Subtle lucency extends through the left cycle medic arch (series 6, image 5), somewhat age indeterminate, but presumably acute. There are acute minimally displaced fractures extending through the posterior wall of the left maxillary sinus. Additional acute nondisplaced fracture extends through the anterior wall. Associated fractures of the left orbital floor with up to 2 mm of depression (series 9, image 31). Comminuted and mildly angulated fractures of the lateral  left orbital wall noted as well. Left lamina per appreciate intact. No acute osseous abnormality about the  contralateral right orbit. Nasal bones intact. Right-to-left nasal septal deviation without fracture. Mandible intact. Mandibular condyles normally situated. No acute abnormality about the dentition. Orbits: Fractures involving the left orbital floor and lateral left orbital wall as above. Small amount of hemorrhage present within the extraconal fat of the inferior left orbit. Globes intact. Prior ocular lens replacement. Sinuses: Hemosinus within the left maxillary sinus. Scattered mucosal thickening present within the sphenoid ethmoidal sinuses. Moderate left mastoid effusion, partially visualized. Soft tissues: Acute left periorbital and facial contusion. No frank hematoma. Limited intracranial: Small focus of acute hemorrhage noted at the anterior/inferior left frontal lobe (series 4, image 71). Finding better characterized on corresponding head CT. IMPRESSION: 1. Acute left facial tripod fracture as detailed above. 2. Small volume hemorrhage within the inferior bony left orbit. No retro-orbital hematoma. Globes intact. 3. Overlying acute left periorbital and facial contusion. 4. Small focus of acute hemorrhage at the anterior/inferior left frontal lobe, better characterized on corresponding head CT. Electronically Signed   By: Jeannine Boga M.D.   On: 02/25/2022 19:52   CT CHEST ABDOMEN PELVIS W CONTRAST  Result Date: 02/25/2022 CLINICAL DATA:  Polytrauma, blunt [id number] Trauma [id number] EXAM: CT CHEST, ABDOMEN, AND PELVIS WITH CONTRAST TECHNIQUE: Multidetector CT imaging of the chest, abdomen and pelvis was performed following the standard protocol during bolus administration of intravenous contrast. RADIATION DOSE REDUCTION: This exam was performed according to the departmental dose-optimization program which includes automated exposure control, adjustment of the mA and/or kV according to patient size and/or use of iterative reconstruction technique. CONTRAST:  73m OMNIPAQUE IOHEXOL 350 MG/ML SOLN  COMPARISON:  CT abdomen pelvis 02/16/2004 FINDINGS: CHEST: Cardiovascular: No aortic injury. The thoracic aorta is normal in caliber. The heart is normal in size. No significant pericardial effusion. Likely mitral annular calcifications. Aortic valve leaflet calcifications. Moderate atherosclerotic plaque of the aorta. Left anterior descending coronary calcification. The main pulmonary artery is normal in caliber. No central pulmonary embolus. Mediastinum/Nodes: No pneumomediastinum. No mediastinal hematoma. The esophagus is unremarkable.  Small hiatal hernia. Subcentimeter thyroid hypodense nodules. Not clinically significant; no follow-up imaging recommended (ref: J Am Coll Radiol. 2015 Feb;12(2): 143-50). The central airways are patent. No mediastinal, hilar, or axillary lymphadenopathy. Lungs/Pleura: No focal consolidation. No pulmonary nodule. No pulmonary mass. No pulmonary contusion or laceration. No pneumatocele formation. No pleural effusion. No pneumothorax. No hemothorax. Musculoskeletal/Chest wall: No chest wall mass.  Left mastectomy. Diffusely decreased bone density. No acute rib or sternal fracture. Old healed bilateral rib fractures. No spinal fracture. Rotator cuff anchor suture along the left humeral head. Severe degenerative changes of the bilateral shoulders. ABDOMEN / PELVIS: Hepatobiliary: Not enlarged. No focal lesion. No laceration or subcapsular hematoma. Status post cholecystectomy.  Pneumobilia. Pancreas: Normal pancreatic contour. No main pancreatic duct dilatation. Spleen: Not enlarged. No focal lesion. No laceration, subcapsular hematoma, or vascular injury. Adrenals/Urinary Tract: No nodularity bilaterally. Bilateral kidneys enhance symmetrically. No hydronephrosis. No contusion, laceration, or subcapsular hematoma. Subcentimeter hypodensities too small to characterize-no further follow-up indicated. No injury to the vascular structures or collecting systems. No hydroureter. The  urinary bladder is unremarkable. Stomach/Bowel: No small or large bowel wall thickening or dilatation. Diffuse sigmoid diverticulosis. Otherwise scattered colonic diverticulosis. The appendix is not definitely identified with no inflammatory changes in the right lower quadrant to suggest acute appendicitis. Vasculature/Lymphatics: No abdominal aorta or iliac aneurysm. No active contrast extravasation or pseudoaneurysm. No abdominal, pelvic,  inguinal lymphadenopathy. Reproductive: Normal. Other: No simple free fluid ascites. No pneumoperitoneum. No hemoperitoneum. No mesenteric hematoma identified. No organized fluid collection. Musculoskeletal: No significant soft tissue hematoma. Bilateral small fat containing inguinal hernias. Diffusely decreased bone density. No acute pelvic fracture. Old healed right hip fracture. No spinal fracture. Grade 2 anterolisthesis of L5 on S1. Grade 1 anterolisthesis of L4 on L5. Ports and Devices: None. IMPRESSION: 1. No acute traumatic injury to the chest, abdomen, or pelvis. 2. No acute fracture or traumatic malalignment of the thoracic or lumbar spine. 3. Other imaging findings of potential clinical significance: Colonic diverticulosis with no acute diverticulitis. Bilateral fat containing inguinal hernias. Aortic Atherosclerosis (ICD10-I70.0) including likely mitral annular, coronary artery, as well as aortic valve leaflet calcifications-correlate for aortic stenosis. Electronically Signed   By: Iven Finn M.D.   On: 02/25/2022 19:51   CT CERVICAL SPINE WO CONTRAST  Result Date: 02/25/2022 CLINICAL DATA:  Unwitnessed fall EXAM: CT CERVICAL SPINE WITHOUT CONTRAST TECHNIQUE: Multidetector CT imaging of the cervical spine was performed without intravenous contrast. Multiplanar CT image reconstructions were also generated. RADIATION DOSE REDUCTION: This exam was performed according to the departmental dose-optimization program which includes automated exposure control,  adjustment of the mA and/or kV according to patient size and/or use of iterative reconstruction technique. COMPARISON:  None Available. FINDINGS: Alignment: Normal. Skull base and vertebrae: Craniocervical alignment is normal. The atlantodental interval is not widened. No acute fracture of the cervical spine. No lytic or blastic bone lesion. Soft tissues and spinal canal: No prevertebral fluid or swelling. No visible canal hematoma. There is extensive atherosclerotic calcification within the carotid bifurcations bilaterally. Disc levels: There is intervertebral disc space narrowing and endplate remodeling throughout the cervical spine, most severe at C5-C7 in keeping with changes of mild to moderate degenerative disc disease. Prevertebral soft tissues are not thickened on sagittal reformats. Spinal canal is widely patent. Multilevel uncovertebral and facet arthrosis results in mild multilevel neuroforaminal narrowing, most severe at C5-C7. Upper chest: Negative. Other: None IMPRESSION: 1. No acute fracture or listhesis of the cervical spine. 2. Extensive atherosclerotic calcification within the carotid bifurcations bilaterally. If indicated, the degree of stenosis would be better assessed with dedicated carotid artery Doppler sonography. Electronically Signed   By: Fidela Salisbury M.D.   On: 02/25/2022 19:42   CT HEAD WO CONTRAST  Result Date: 02/25/2022 CLINICAL DATA:  Trauma EXAM: CT HEAD WITHOUT CONTRAST TECHNIQUE: Contiguous axial images were obtained from the base of the skull through the vertex without intravenous contrast. RADIATION DOSE REDUCTION: This exam was performed according to the departmental dose-optimization program which includes automated exposure control, adjustment of the mA and/or kV according to patient size and/or use of iterative reconstruction technique. COMPARISON:  None Available. FINDINGS: Brain: There is a very small amount of subarachnoid hemorrhage in the anterior left frontal  lobe image 4/16 and 4/20 there is no mass effect, midline shift or extra-axial fluid collection. There is no acute infarct. There is mild diffuse atrophy. There is moderate periventricular and deep white matter hypodensity, likely chronic small vessel ischemic change. Vascular: Atherosclerotic calcifications are present within the cavernous internal carotid arteries. Skull: Negative for skull fracture. Fractures of the left maxilla and left orbit noted. Please see dedicated CT maxillofacial for further description. Sinuses/Orbits: There is hemorrhage/air-fluid level in the left maxillary sinus. Bilateral mastoid effusions are present. Other: Left periorbital swelling. IMPRESSION: 1. Very small amount of subarachnoid hemorrhage in the anterior left frontal lobe. No mass effect or midline shift. 2.  Atrophy and chronic small vessel ischemic changes. 3. Fractures of the left maxilla and left orbit. Please see dedicated CT maxillofacial for further description. Electronically Signed   By: Ronney Asters M.D.   On: 02/25/2022 19:39   DG Humerus Left  Result Date: 02/25/2022 CLINICAL DATA:  Trauma to the left upper extremity. EXAM: LEFT SHOULDER; LEFT HUMERUS - 2+ VIEW COMPARISON:  Chest radiograph dated 02/25/2022. FINDINGS: No acute fracture or dislocation of the left shoulder. The bones are osteopenic. There is degenerative changes with calcific tendinopathy of the supraspinatus muscle. Humeral head anchor pins noted. Multiple old appearing left rib fractures. Several surgical clips noted over the left axilla. IMPRESSION: 1. No acute fracture or dislocation of the left shoulder or left humerus. 2. Osteopenia and degenerative changes. Electronically Signed   By: Anner Crete M.D.   On: 02/25/2022 18:52   DG Shoulder Left Port  Result Date: 02/25/2022 CLINICAL DATA:  Trauma to the left upper extremity. EXAM: LEFT SHOULDER; LEFT HUMERUS - 2+ VIEW COMPARISON:  Chest radiograph dated 02/25/2022. FINDINGS: No acute  fracture or dislocation of the left shoulder. The bones are osteopenic. There is degenerative changes with calcific tendinopathy of the supraspinatus muscle. Humeral head anchor pins noted. Multiple old appearing left rib fractures. Several surgical clips noted over the left axilla. IMPRESSION: 1. No acute fracture or dislocation of the left shoulder or left humerus. 2. Osteopenia and degenerative changes. Electronically Signed   By: Anner Crete M.D.   On: 02/25/2022 18:52   DG Chest Port 1 View  Result Date: 02/25/2022 CLINICAL DATA:  Trauma EXAM: PORTABLE CHEST 1 VIEW COMPARISON:  12/20/2021, 12/13/2021 FINDINGS: Heart size is stable. No focal airspace consolidation, pleural effusion, or pneumothorax. Multiple chronic bilateral rib fractures. No definite acute rib fracture is seen. IMPRESSION: 1. No acute findings within the chest. 2. Multiple chronic bilateral rib fractures. No definite acute rib fracture is seen. Electronically Signed   By: Davina Poke D.O.   On: 02/25/2022 18:36   DG Pelvis Portable  Result Date: 02/25/2022 CLINICAL DATA:  Trauma. EXAM: PORTABLE PELVIS 1-2 VIEWS COMPARISON:  None Available. FINDINGS: No fracture.  No bone lesion. Hip joints, SI joints and pubic symphysis are normally aligned. Skeletal structures are demineralized. Soft tissues are unremarkable. IMPRESSION: No fracture or acute finding. Electronically Signed   By: Lajean Manes M.D.   On: 02/25/2022 18:36    Imaging: Personally reviewed all imaging studies from today  A/P: Cattie ANNISSA REIS is an 89 y.o. female  Status post fall Small subarachnoid hemorrhage frontal lobe Left facial tripod fractures History of DVT on Xarelto Hyponatremia Left wrist pain with swelling and ecchymosis Left periorbital ecchymosis and swelling Small left eyebrow laceration with Left radius, ulnar and styloid  fracture  Admit inpatient to medicine Neurosurgery consult Facial trauma consult Patient may benefit from  syncopal evaluation Serial neurochecks Monitor sodium level Hold chemical VTE prophylaxis until cleared by neurosurgery PT and OT consults Will need hand consult for her wrist fracture  If patient having multiple falls she may need to have conversation with family members, primary care team about pros and cons of continuing systemic blood thinners  Reportedly per neurosurgery does not need reversal of her Xarelto, repeat head CT if patient has change in neurological status  Data reviewed-I reviewed her CT CT head, C-spine, face, chest and pelvis, x-rays of her chest, pelvis, left shoulder, left wrist; neurosurgery consult note, discussed case with [facility] hospitalist; labs February 25, 2022, vital signs for  the past 4 hours  High-level medical decision making  Leighton Ruff. Redmond Pulling, MD, FACS General, Bariatric, & Minimally Invasive Surgery [place]

## 2022-02-25 NOTE — H&P (Addendum)
History and Physical    Stephanie Pearson [id number] DOB: [birth year] DOA: 02/25/2022  PCP: Merry Lofty, NP-C   Patient coming from: Home  Chief Complaint: Fall with facial trauma   HPI: Stephanie Pearson is a pleasant 89 y.o. female with medical history significant for hypertension, hyponatremia, and history of DVT on Xarelto who presents to the emergency department with left periorbital hematoma and small laceration after a fall at home.  Patient reports that she was in her usual state of health and having an uneventful day when she was in the kitchen preparing a drink.  She believes she must of spilled some liquid which caused her to slip.  At time of admission, her only complaint is left wrist pain.  She denies any recent illness.    ED Course: Upon arrival to the ED, patient is found to be afebrile and saturating well on room air with normal heart rate and stable blood pressure.  EKG demonstrates sinus rhythm.  No acute findings noted on radiographs of the chest, pelvis, left shoulder, and left humerus.  CT of the chest, abdomen, and pelvis is negative for acute findings.  Maxillofacial CT notable for acute left facial tripod fractures.  Very small subarachnoid hemorrhages noted in the anterior left frontal lobe on CT.  Neurosurgery, ENT, and trauma surgery were consulted by the ED physician.  Patient was given a liter of normal saline, acetaminophen, and Tdap.  Review of Systems:  All other systems reviewed and apart from HPI, are negative.  Past Medical History:  Diagnosis Date   Cancer [facility])    History of blood clots    Hypertension    Hyponatremia 02/25/2022   Personal history of chemotherapy 1999    Past Surgical History:  Procedure Laterality Date   ABDOMINAL HYSTERECTOMY     BREAST EXCISIONAL BIOPSY Right 2002   BREAST EXCISIONAL BIOPSY Bilateral 05/08/1997   BREAST SURGERY     EYE SURGERY     MASTECTOMY Left 1999   ROTATOR CUFF REPAIR     spot removed from lung       Social History:   reports that she has never smoked. She has never used smokeless tobacco. She reports that she does not drink alcohol and does not use drugs.  Allergies  Allergen Reactions   Prilosec [Omeprazole] Rash    Family History  Problem Relation Age of Onset   Breast cancer Sister      Prior to Admission medications   Medication Sig Start Date End Date Taking? Authorizing Provider  amLODipine (NORVASC) 2.5 MG tablet  05/09/13   [provider]  fosinopril (MONOPRIL) 40 MG tablet  06/16/13   [provider]  lovastatin (MEVACOR) 40 MG tablet  05/12/13   [provider]  metoprolol (LOPRESSOR) 50 MG tablet  05/30/13   [provider]  pantoprazole (PROTONIX) 40 MG tablet  04/07/13   [provider]  XARELTO 20 MG TABS tablet  05/20/13   [provider]    Physical Exam: Vitals:   02/25/22 2015 02/25/22 2030 02/25/22 2100 02/25/22 2115  BP: (!) 109/95 126/84  128/62  Pulse: 83 81 80 79  Resp: 16 15 (!) 21 15  Temp:      TempSrc:      SpO2: 100% 100% 100% 98%  Weight:      Height:         Constitutional: NAD, calm  Eyes: PERTLA, lids and conjunctivae normal ENMT: Mucous membranes are moist. Posterior  pharynx clear of any exudate or lesions.   Neck: supple, no masses  Respiratory: no wheezing, no crackles. No accessory muscle use.  Cardiovascular: S1 & S2 heard, regular rate and rhythm. No JVD. Abdomen: No distension, no tenderness, soft. Bowel sounds active.  Musculoskeletal: no clubbing / cyanosis. Left wrist edema and tenderness; left proximal humerus ecchymosis.   Skin: Left periorbital hematoma and small laceration. Warm, dry, well-perfused. Neurologic: CN 2-12 grossly intact. Moving all extremities. Alert and oriented to person, place, and situation.  Psychiatric: Pleasant. Cooperative.    Labs and Imaging on Admission: I have personally reviewed following labs and imaging studies  CBC: Recent Labs   Lab 02/25/22 1817 02/25/22 1832  WBC 15.4*  --   HGB 12.9 14.3  HCT 40.2 42.0  MCV 87.8  --   PLT 193  --    Basic Metabolic Panel: Recent Labs  Lab 02/25/22 1817 02/25/22 1832  NA 128* 129*  K 4.2 4.1  CL 93* 94*  CO2 24  --   GLUCOSE 126* 124*  BUN 12 14  CREATININE 0.87 0.70  CALCIUM 9.0  --    GFR: Estimated Creatinine Clearance: 40.4 mL/min (by C-G formula based on SCr of 0.7 mg/dL). Liver Function Tests: Recent Labs  Lab 02/25/22 1817  AST 39  ALT 16  ALKPHOS 72  BILITOT 0.9  PROT 6.9  ALBUMIN 3.5   No results for input(s): "LIPASE", "AMYLASE" in the last 168 hours. No results for input(s): "AMMONIA" in the last 168 hours. Coagulation Profile: Recent Labs  Lab 02/25/22 1817  INR 1.4*   Cardiac Enzymes: Recent Labs  Lab 02/25/22 1817  CKTOTAL 227   BNP (last 3 results) No results for input(s): "PROBNP" in the last 8760 hours. HbA1C: No results for input(s): "HGBA1C" in the last 72 hours. CBG: No results for input(s): "GLUCAP" in the last 168 hours. Lipid Profile: No results for input(s): "CHOL", "HDL", "LDLCALC", "TRIG", "CHOLHDL", "LDLDIRECT" in the last 72 hours. Thyroid Function Tests: No results for input(s): "TSH", "T4TOTAL", "FREET4", "T3FREE", "THYROIDAB" in the last 72 hours. Anemia Panel: No results for input(s): "VITAMINB12", "FOLATE", "FERRITIN", "TIBC", "IRON", "RETICCTPCT" in the last 72 hours. Urine analysis:    Component Value Date/Time   COLORURINE STRAW (A) 02/25/2022 2120   APPEARANCEUR CLEAR 02/25/2022 2120   LABSPEC 1.023 02/25/2022 2120   PHURINE 7.0 02/25/2022 2120   GLUCOSEU NEGATIVE 02/25/2022 2120   HGBUR SMALL (A) 02/25/2022 2120   BILIRUBINUR NEGATIVE 02/25/2022 2120   KETONESUR 5 (A) 02/25/2022 2120   PROTEINUR NEGATIVE 02/25/2022 2120   NITRITE NEGATIVE 02/25/2022 2120   LEUKOCYTESUR NEGATIVE 02/25/2022 2120   Sepsis Labs: @LABRCNTIP$ (procalcitonin:4,lacticidven:4) )No results found for this or any  previous visit (from the past 240 hour(s)).   Radiological Exams on Admission: CT MAXILLOFACIAL WO CONTRAST  Result Date: 02/25/2022 CLINICAL DATA:  Initial evaluation for acute trauma, fall. EXAM: CT MAXILLOFACIAL WITHOUT CONTRAST TECHNIQUE: Multidetector CT imaging of the maxillofacial structures was performed. Multiplanar CT image reconstructions were also generated. RADIATION DOSE REDUCTION: This exam was performed according to the departmental dose-optimization program which includes automated exposure control, adjustment of the mA and/or kV according to patient size and/or use of iterative reconstruction technique. COMPARISON:  None Available. FINDINGS: Osseous: Subtle lucency extends through the left cycle medic arch (series 6, image 76), somewhat age indeterminate, but presumably acute. There are acute minimally displaced fractures extending through the posterior wall of the left maxillary sinus. Additional acute nondisplaced fracture extends through the anterior wall.  Associated fractures of the left orbital floor with up to 2 mm of depression (series 9, image 31). Comminuted and mildly angulated fractures of the lateral left orbital wall noted as well. Left lamina per appreciate intact. No acute osseous abnormality about the contralateral right orbit. Nasal bones intact. Right-to-left nasal septal deviation without fracture. Mandible intact. Mandibular condyles normally situated. No acute abnormality about the dentition. Orbits: Fractures involving the left orbital floor and lateral left orbital wall as above. Small amount of hemorrhage present within the extraconal fat of the inferior left orbit. Globes intact. Prior ocular lens replacement. Sinuses: Hemosinus within the left maxillary sinus. Scattered mucosal thickening present within the sphenoid ethmoidal sinuses. Moderate left mastoid effusion, partially visualized. Soft tissues: Acute left periorbital and facial contusion. No frank hematoma.  Limited intracranial: Small focus of acute hemorrhage noted at the anterior/inferior left frontal lobe (series 4, image 71). Finding better characterized on corresponding head CT. IMPRESSION: 1. Acute left facial tripod fracture as detailed above. 2. Small volume hemorrhage within the inferior bony left orbit. No retro-orbital hematoma. Globes intact. 3. Overlying acute left periorbital and facial contusion. 4. Small focus of acute hemorrhage at the anterior/inferior left frontal lobe, better characterized on corresponding head CT. Electronically Signed   By: Jeannine Boga M.D.   On: 02/25/2022 19:52   CT CHEST ABDOMEN PELVIS W CONTRAST  Result Date: 02/25/2022 CLINICAL DATA:  Polytrauma, blunt [id number] Trauma [id number] EXAM: CT CHEST, ABDOMEN, AND PELVIS WITH CONTRAST TECHNIQUE: Multidetector CT imaging of the chest, abdomen and pelvis was performed following the standard protocol during bolus administration of intravenous contrast. RADIATION DOSE REDUCTION: This exam was performed according to the departmental dose-optimization program which includes automated exposure control, adjustment of the mA and/or kV according to patient size and/or use of iterative reconstruction technique. CONTRAST:  68m OMNIPAQUE IOHEXOL 350 MG/ML SOLN COMPARISON:  CT abdomen pelvis 02/16/2004 FINDINGS: CHEST: Cardiovascular: No aortic injury. The thoracic aorta is normal in caliber. The heart is normal in size. No significant pericardial effusion. Likely mitral annular calcifications. Aortic valve leaflet calcifications. Moderate atherosclerotic plaque of the aorta. Left anterior descending coronary calcification. The main pulmonary artery is normal in caliber. No central pulmonary embolus. Mediastinum/Nodes: No pneumomediastinum. No mediastinal hematoma. The esophagus is unremarkable.  Small hiatal hernia. Subcentimeter thyroid hypodense nodules. Not clinically significant; no follow-up imaging recommended (ref: J Am Coll  Radiol. 2015 Feb;12(2): 143-50). The central airways are patent. No mediastinal, hilar, or axillary lymphadenopathy. Lungs/Pleura: No focal consolidation. No pulmonary nodule. No pulmonary mass. No pulmonary contusion or laceration. No pneumatocele formation. No pleural effusion. No pneumothorax. No hemothorax. Musculoskeletal/Chest wall: No chest wall mass.  Left mastectomy. Diffusely decreased bone density. No acute rib or sternal fracture. Old healed bilateral rib fractures. No spinal fracture. Rotator cuff anchor suture along the left humeral head. Severe degenerative changes of the bilateral shoulders. ABDOMEN / PELVIS: Hepatobiliary: Not enlarged. No focal lesion. No laceration or subcapsular hematoma. Status post cholecystectomy.  Pneumobilia. Pancreas: Normal pancreatic contour. No main pancreatic duct dilatation. Spleen: Not enlarged. No focal lesion. No laceration, subcapsular hematoma, or vascular injury. Adrenals/Urinary Tract: No nodularity bilaterally. Bilateral kidneys enhance symmetrically. No hydronephrosis. No contusion, laceration, or subcapsular hematoma. Subcentimeter hypodensities too small to characterize-no further follow-up indicated. No injury to the vascular structures or collecting systems. No hydroureter. The urinary bladder is unremarkable. Stomach/Bowel: No small or large bowel wall thickening or dilatation. Diffuse sigmoid diverticulosis. Otherwise scattered colonic diverticulosis. The appendix is not definitely identified with no inflammatory changes  in the right lower quadrant to suggest acute appendicitis. Vasculature/Lymphatics: No abdominal aorta or iliac aneurysm. No active contrast extravasation or pseudoaneurysm. No abdominal, pelvic, inguinal lymphadenopathy. Reproductive: Normal. Other: No simple free fluid ascites. No pneumoperitoneum. No hemoperitoneum. No mesenteric hematoma identified. No organized fluid collection. Musculoskeletal: No significant soft tissue hematoma.  Bilateral small fat containing inguinal hernias. Diffusely decreased bone density. No acute pelvic fracture. Old healed right hip fracture. No spinal fracture. Grade 2 anterolisthesis of L5 on S1. Grade 1 anterolisthesis of L4 on L5. Ports and Devices: None. IMPRESSION: 1. No acute traumatic injury to the chest, abdomen, or pelvis. 2. No acute fracture or traumatic malalignment of the thoracic or lumbar spine. 3. Other imaging findings of potential clinical significance: Colonic diverticulosis with no acute diverticulitis. Bilateral fat containing inguinal hernias. Aortic Atherosclerosis (ICD10-I70.0) including likely mitral annular, coronary artery, as well as aortic valve leaflet calcifications-correlate for aortic stenosis. Electronically Signed   By: Iven Finn M.D.   On: 02/25/2022 19:51   CT CERVICAL SPINE WO CONTRAST  Result Date: 02/25/2022 CLINICAL DATA:  Unwitnessed fall EXAM: CT CERVICAL SPINE WITHOUT CONTRAST TECHNIQUE: Multidetector CT imaging of the cervical spine was performed without intravenous contrast. Multiplanar CT image reconstructions were also generated. RADIATION DOSE REDUCTION: This exam was performed according to the departmental dose-optimization program which includes automated exposure control, adjustment of the mA and/or kV according to patient size and/or use of iterative reconstruction technique. COMPARISON:  None Available. FINDINGS: Alignment: Normal. Skull base and vertebrae: Craniocervical alignment is normal. The atlantodental interval is not widened. No acute fracture of the cervical spine. No lytic or blastic bone lesion. Soft tissues and spinal canal: No prevertebral fluid or swelling. No visible canal hematoma. There is extensive atherosclerotic calcification within the carotid bifurcations bilaterally. Disc levels: There is intervertebral disc space narrowing and endplate remodeling throughout the cervical spine, most severe at C5-C7 in keeping with changes of mild  to moderate degenerative disc disease. Prevertebral soft tissues are not thickened on sagittal reformats. Spinal canal is widely patent. Multilevel uncovertebral and facet arthrosis results in mild multilevel neuroforaminal narrowing, most severe at C5-C7. Upper chest: Negative. Other: None IMPRESSION: 1. No acute fracture or listhesis of the cervical spine. 2. Extensive atherosclerotic calcification within the carotid bifurcations bilaterally. If indicated, the degree of stenosis would be better assessed with dedicated carotid artery Doppler sonography. Electronically Signed   By: Fidela Salisbury M.D.   On: 02/25/2022 19:42   CT HEAD WO CONTRAST  Result Date: 02/25/2022 CLINICAL DATA:  Trauma EXAM: CT HEAD WITHOUT CONTRAST TECHNIQUE: Contiguous axial images were obtained from the base of the skull through the vertex without intravenous contrast. RADIATION DOSE REDUCTION: This exam was performed according to the departmental dose-optimization program which includes automated exposure control, adjustment of the mA and/or kV according to patient size and/or use of iterative reconstruction technique. COMPARISON:  None Available. FINDINGS: Brain: There is a very small amount of subarachnoid hemorrhage in the anterior left frontal lobe image 4/16 and 4/20 there is no mass effect, midline shift or extra-axial fluid collection. There is no acute infarct. There is mild diffuse atrophy. There is moderate periventricular and deep white matter hypodensity, likely chronic small vessel ischemic change. Vascular: Atherosclerotic calcifications are present within the cavernous internal carotid arteries. Skull: Negative for skull fracture. Fractures of the left maxilla and left orbit noted. Please see dedicated CT maxillofacial for further description. Sinuses/Orbits: There is hemorrhage/air-fluid level in the left maxillary sinus. Bilateral mastoid effusions are present.  Other: Left periorbital swelling. IMPRESSION: 1. Very  small amount of subarachnoid hemorrhage in the anterior left frontal lobe. No mass effect or midline shift. 2. Atrophy and chronic small vessel ischemic changes. 3. Fractures of the left maxilla and left orbit. Please see dedicated CT maxillofacial for further description. Electronically Signed   By: Ronney Asters M.D.   On: 02/25/2022 19:39   DG Humerus Left  Result Date: 02/25/2022 CLINICAL DATA:  Trauma to the left upper extremity. EXAM: LEFT SHOULDER; LEFT HUMERUS - 2+ VIEW COMPARISON:  Chest radiograph dated 02/25/2022. FINDINGS: No acute fracture or dislocation of the left shoulder. The bones are osteopenic. There is degenerative changes with calcific tendinopathy of the supraspinatus muscle. Humeral head anchor pins noted. Multiple old appearing left rib fractures. Several surgical clips noted over the left axilla. IMPRESSION: 1. No acute fracture or dislocation of the left shoulder or left humerus. 2. Osteopenia and degenerative changes. Electronically Signed   By: Anner Crete M.D.   On: 02/25/2022 18:52   DG Shoulder Left Port  Result Date: 02/25/2022 CLINICAL DATA:  Trauma to the left upper extremity. EXAM: LEFT SHOULDER; LEFT HUMERUS - 2+ VIEW COMPARISON:  Chest radiograph dated 02/25/2022. FINDINGS: No acute fracture or dislocation of the left shoulder. The bones are osteopenic. There is degenerative changes with calcific tendinopathy of the supraspinatus muscle. Humeral head anchor pins noted. Multiple old appearing left rib fractures. Several surgical clips noted over the left axilla. IMPRESSION: 1. No acute fracture or dislocation of the left shoulder or left humerus. 2. Osteopenia and degenerative changes. Electronically Signed   By: Anner Crete M.D.   On: 02/25/2022 18:52   DG Chest Port 1 View  Result Date: 02/25/2022 CLINICAL DATA:  Trauma EXAM: PORTABLE CHEST 1 VIEW COMPARISON:  12/20/2021, 12/13/2021 FINDINGS: Heart size is stable. No focal airspace consolidation, pleural  effusion, or pneumothorax. Multiple chronic bilateral rib fractures. No definite acute rib fracture is seen. IMPRESSION: 1. No acute findings within the chest. 2. Multiple chronic bilateral rib fractures. No definite acute rib fracture is seen. Electronically Signed   By: Davina Poke D.O.   On: 02/25/2022 18:36   DG Pelvis Portable  Result Date: 02/25/2022 CLINICAL DATA:  Trauma. EXAM: PORTABLE PELVIS 1-2 VIEWS COMPARISON:  None Available. FINDINGS: No fracture.  No bone lesion. Hip joints, SI joints and pubic symphysis are normally aligned. Skeletal structures are demineralized. Soft tissues are unremarkable. IMPRESSION: No fracture or acute finding. Electronically Signed   By: Lajean Manes M.D.   On: 02/25/2022 18:36    EKG: Independently reviewed. Sinus rhythm.   Assessment/Plan   1. Fall with SAH and facial fractures   - Very small SAH and tripod fracture noted on CT in ED   - Neurosurgery consulted by ED, advises against reversing anticoagulant, and recommends repeat CT only if there is a change in patient status  - ENT consulted by ED physician and recommends pain-control, no nose blowing, and outpatient follow-up  - Appreciate trauma surgery evaluation in ED  - Continue pain-control, neuro checks, hold Xarelto initially, check left wrist radiographs, repeat head CT only as needed for clinical change   2. Hx of DVT  - Xarelto held on admission    3. HTN  - Continue amlodipine, ACE-i, metoprolol    4. Hyponatremia  - Appears chronic and stable    5. Goals of care   - Patient is alert and fully oriented in the ED and indicates that she wants to be DNR  -  Patient's daughter states that she is HCPOA and wants patient to be full code despite this  - Consult palliative care    DVT prophylaxis: SCDs  Code Status: Full code  Level of Care: Level of care: Telemetry Medical Family Communication: Grandson at bedside  Disposition Plan:  Patient is from: home  Anticipated d/c is  to: Home  Anticipated d/c date is: 2/11 or 02/27/22  Patient currently: Pending clinical stability  Consults called: Neurosurgery, ENT, and trauma surgery consulted by ED physician  Admission status: Observation     Vianne Bulls, MD [facility]  02/25/2022, 11:10 PM

## 2022-02-25 NOTE — ED Notes (Addendum)
Patient's daughter, Francene Finders advised this RN that she was the [facility] and wanted patient to be a FULL CODE.  Patient's daughter stated that "her mother doesn't have the right to make that decision because she doesn't understand." Patient has remained Aox4 in ER.  MD Opyd made aware face to face.

## 2022-02-25 NOTE — ED Notes (Signed)
EDP at bedside  

## 2022-02-25 NOTE — Progress Notes (Signed)
Called in regards to this patient's CT head. She had a fall at home and is on blood thinners for afib. CT showed a very small anterior right frontal lob SAH with no mass effect. No need for surgical intervention or follow up head CT unless she has a neuro change. No need to stop blood thinner at this point since it is so minimal.

## 2022-02-26 DIAGNOSIS — W19XXXA Unspecified fall, initial encounter: Secondary | ICD-10-CM

## 2022-02-26 DIAGNOSIS — S52502A Unspecified fracture of the lower end of left radius, initial encounter for closed fracture: Secondary | ICD-10-CM | POA: Diagnosis not present

## 2022-02-26 DIAGNOSIS — S62102A Fracture of unspecified carpal bone, left wrist, initial encounter for closed fracture: Secondary | ICD-10-CM | POA: Diagnosis present

## 2022-02-26 DIAGNOSIS — I609 Nontraumatic subarachnoid hemorrhage, unspecified: Secondary | ICD-10-CM | POA: Diagnosis not present

## 2022-02-26 LAB — BASIC METABOLIC PANEL
Anion gap: 11 (ref 5–15)
BUN: 10 mg/dL (ref 8–23)
CO2: 21 mmol/L — ABNORMAL LOW (ref 22–32)
Calcium: 8.4 mg/dL — ABNORMAL LOW (ref 8.9–10.3)
Chloride: 99 mmol/L (ref 98–111)
Creatinine, Ser: 0.84 mg/dL (ref 0.44–1.00)
GFR, Estimated: >60 mL/min (ref >60)
Glucose, Bld: 111 mg/dL — ABNORMAL HIGH (ref 70–99)
Potassium: 3.4 mmol/L — ABNORMAL LOW (ref 3.5–5.1)
Sodium: 131 mmol/L — ABNORMAL LOW (ref 135–145)

## 2022-02-26 LAB — CBC
HCT: 31.6 % — ABNORMAL LOW (ref 36.0–46.0)
Hemoglobin: 10.6 g/dL — ABNORMAL LOW (ref 12.0–15.0)
MCH: 28.6 pg (ref 26.0–34.0)
MCHC: 33.5 g/dL (ref 30.0–36.0)
MCV: 85.4 fL (ref 80.0–100.0)
Platelets: 179 10*3/uL (ref 150–400)
RBC: 3.7 MIL/uL — ABNORMAL LOW (ref 3.87–5.11)
RDW: 14.9 % (ref 11.5–15.5)
WBC: 10.2 10*3/uL (ref 4.0–10.5)
nRBC: 0 % (ref 0.0–0.2)

## 2022-02-26 NOTE — Progress Notes (Signed)
  Progress Note   Patient: Stephanie Pearson YQM:[id number] DOB: November 23, 1927 DOA: 02/25/2022     0 DOS: the patient was seen and examined on 02/26/2022        Brief hospital course: Mrs. Herbison is a 89 y.o. F lives alone, hx HTN, DVT on Xarelto who fell in her kitchen.  In the ER, found to have small SAH, fracture of facial tripod and wrist fracture.     Assessment and Plan: [facility] (subarachnoid hemorrhage) [facility]) Neurosurgery consulted.  They recommend repeat CTH if neurological changes, otherwise no specific follow up needed.  So far, she is neurologically intact, no changes. - Hold home Xarelto - Defer timing of restart to PCP after discharge    Closed fracture of tripod, initial encounter ([facility]) - No nose blowing - Analgesics as needed - Follow up with Ophthalmology after discharge, Dr. Lucianne Lei info in discharge instructions  Fall at home, initial encounter Patient able to recount to me in detail her fall, was mechanical in nature, no LOC/syncope/near syncope.  Wrist fracture, closed, left, initial encounter - Maintain in sugartong and sling - Follow up with Dr. Percell Miller in 1 week  History of DVT (deep vein thrombosis) - Hold Xarelto at discharge  Hyponatremia Mild, asymptomatic.  No work up needed  Essential hypertension - Continue amlodipine, lisinopril, metoprolol  Head laceration Sutured in the ER - Remove sutures in 7-10 days as directed by the ER  Hypokalemia Supplement potassium       Subjective: Patient feeling better but still in a lot of pain.  No nefw neurological changes. No fever, no confusion.     Physical Exam: BP (!) 120/54   Pulse 60   Temp 97.7 F (36.5 C) (Oral)   Resp 16   Ht '5\' 5"'$  (1.651 m)   Wt 59.7 kg   SpO2 100%   BMI 21.90 kg/m   Elderly adult female lying in bed, echymosis on left eye, laceration RRR, no murmurs, no peripheral edema Respiratory rate normal, lungs clear without rales or wheezes Abdomen soft without  tenderness palpation or guarding Left arm in sling Attention normal, affect appropriate, appears tired, but oriented to person, place, time, and situation.  Right arm strength seems normal, left arm not tested due to pain/sling.  Lower extremity strength generally weak but symmetric   Data Reviewed: Discussed with trauma surgery Sodium up to 131, potassium 3.4  Family Communication: Daughter at the bedside   Disposition: Status is: Observation Patient will need physical therapy evaluation, possibly placement, if physical therapy recommended home health, we will get her home tomorrow morning.         Author: Edwin Dada, MD 02/26/2022 4:06 PM  For on call review [email or web address].

## 2022-02-26 NOTE — Evaluation (Addendum)
Physical Therapy Evaluation Patient Details Name: Stephanie Pearson MRN: [id number] DOB: [birth year] Today's Date: 02/26/2022  History of Present Illness  Pt is a 89 y/o female presenting after ground level fall. Found with small subarachnoid hemorrhage of frontal lobe, L facial tripod fractures, L radius, ulnar, and styloid fx. PMH - lt breast CA, HTN, DVT  Clinical Impression  Pt presents to PT with decr mobility after fall at home and suffering SAH and lt wrist fx. Pt typically is very independent at home and until November bout with RSV didn't use any assistive device. Most recently using quad cane. Expect pt will need some initial assist with mobility at home and if family can provide that assist can return home with HHPT. If family unable to provide assist will need to look at alternatives.        Recommendations for follow up therapy are one component of a multi-disciplinary discharge planning process, led by the attending physician.  Recommendations may be updated based on patient status, additional functional criteria and insurance authorization.  Follow Up Recommendations Home health PT      Assistance Recommended at Discharge Frequent or constant Supervision/Assistance  Patient can return home with the following  A little help with walking and/or transfers;A little help with bathing/dressing/bathroom;Assistance with cooking/housework;Help with stairs or ramp for entrance    Equipment Recommendations None recommended by PT  Recommendations for Other Services       Functional Status Assessment Patient has had a recent decline in their functional status and demonstrates the ability to make significant improvements in function in a reasonable and predictable amount of time.     Precautions / Restrictions Precautions Precautions: Fall Required Braces or Orthoses: Sling;Splint/Cast Splint/Cast: Lt forearm/wrist splint Restrictions Weight Bearing Restrictions: Yes LUE Weight  Bearing: Non weight bearing      Mobility  Bed Mobility Overal bed mobility: Needs Assistance Bed Mobility: Supine to Sit     Supine to sit: Min assist, HOB elevated     General bed mobility comments: Assist to pull up on therapist's hand to elevate trunk into sitting    Transfers Overall transfer level: Needs assistance Equipment used: 1 person hand held assist Transfers: Sit to/from Stand Sit to Stand: Mod assist           General transfer comment: Assist to bring hips up and to stabilize    Ambulation/Gait Ambulation/Gait assistance: Min assist, +2 safety/equipment Gait Distance (Feet): 5 Feet Assistive device: 1 person hand held assist Gait Pattern/deviations: Step-to pattern, Decreased step length - right, Decreased step length - left, Shuffle Gait velocity: decr Gait velocity interpretation: <1.31 ft/sec, indicative of household ambulator   General Gait Details: Assist for balance and support  Stairs            Wheelchair Mobility    Modified Rankin (Stroke Patients Only)       Balance Overall balance assessment: Needs assistance Sitting-balance support: No upper extremity supported, Feet supported Sitting balance-Leahy Scale: Fair     Standing balance support: Single extremity supported, During functional activity Standing balance-Leahy Scale: Poor Standing balance comment: UE support and min assist for static standing                             Pertinent Vitals/Pain Pain Assessment Pain Assessment: No/denies pain    Home Living Family/patient expects to be discharged to:: Private residence Living Arrangements: Children (daughter and son in law) Available Help at Discharge:  Family;Available PRN/intermittently Type of Home: House Home Access: Stairs to enter   Entrance Stairs-Number of Steps: 2   Home Layout: Two level;Full bath on main level;Able to live on main level with bedroom/bathroom Home Equipment: Rolling Walker  (2 wheels);Grab bars - toilet;Grab bars - tub/shower;Shower seat;Shower seat - built in;Cane - single point Additional Comments: always has support at night    Prior Function Prior Level of Function : Needs assist             Mobility Comments: using quad cane for mobility ADLs Comments: no driving, independent ADLs, family assist with IADLs but pt completes meds using pill box     Hand Dominance   Dominant Hand: Right    Extremity/Trunk Assessment   Upper Extremity Assessment Upper Extremity Assessment: Defer to OT evaluation    Lower Extremity Assessment Lower Extremity Assessment: Generalized weakness       Communication   Communication: No difficulties  Cognition Arousal/Alertness: Awake/alert Behavior During Therapy: WFL for tasks assessed/performed Overall Cognitive Status: History of cognitive impairments - at baseline                                 General Comments: Mild memory impairment        General Comments General comments (skin integrity, edema, etc.): VSS on RA    Exercises     Assessment/Plan    PT Assessment Patient needs continued PT services  PT Problem List Decreased strength;Decreased balance;Decreased mobility       PT Treatment Interventions DME instruction;Gait training;Stair training;Functional mobility training;Therapeutic activities;Therapeutic exercise;Balance training;Patient/family education    PT Goals (Current goals can be found in the Care Plan section)  Acute Rehab PT Goals Patient Stated Goal: return home PT Goal Formulation: With patient/family Time For Goal Achievement: 03/12/22 Potential to Achieve Goals: Good    Frequency Min 3X/week     Co-evaluation PT/OT/SLP Co-Evaluation/Treatment: Yes Reason for Co-Treatment: For patient/therapist safety PT goals addressed during session: Mobility/safety with mobility         AM-PAC PT "6 Clicks" Mobility  Outcome Measure Help needed turning from  your back to your side while in a flat bed without using bedrails?: A Little Help needed moving from lying on your back to sitting on the side of a flat bed without using bedrails?: A Little Help needed moving to and from a bed to a chair (including a wheelchair)?: A Little Help needed standing up from a chair using your arms (e.g., wheelchair or bedside chair)?: A Lot Help needed to walk in hospital room?: A Little Help needed climbing 3-5 steps with a railing? : Total 6 Click Score: 15    End of Session   Activity Tolerance: Patient tolerated treatment well Patient left: in chair;with call bell/phone within reach;with chair alarm set;with family/visitor present Nurse Communication: Mobility status PT Visit Diagnosis: Unsteadiness on feet (R26.81);Other abnormalities of gait and mobility (R26.89);History of falling (Z91.81)    Time: [id number] PT Time Calculation (min) (ACUTE ONLY): 23 min   Charges:   PT Evaluation $PT Eval Low Complexity: [place] [facility] [place] 02/26/2022, 10:09 AM

## 2022-02-26 NOTE — ED Notes (Signed)
ED TO INPATIENT HANDOFF REPORT  ED Nurse Name and Phone #: Newman Pies [phone]  S Name/Age/Gender Stephanie Pearson 89 y.o. female Room/Bed: TRACC/TRACC  Code Status   Code Status: Full Code  Home/SNF/Other Rehab Patient oriented to: self, place, time, and situation Is this baseline? Yes   Triage Complete: Triage complete  Chief Complaint SAH (subarachnoid hemorrhage) ([place]) [I60.9]  Triage Note Pt BIB EMS as Level 2 fall on thinners. Granddaughter last spoke to pt at 50 & then found her on the floor laying on Lt side 5 hrs later, takes Xarelto, Hx of Lt Breast CA. GCS 15, A/Ox4, Lt eye swollen shut & purple, a small lac above that eye brow still actively trickling blood, Bruising & reported pain to Lt arm/shoulder as well. CBG 135, 110 bpm, 147/98, 97% on RA.    Allergies Allergies  Allergen Reactions   Prilosec [Omeprazole] Rash    Level of Care/Admitting Diagnosis ED Disposition     ED Disposition  Admit   Condition  --   [place]: [place] [[id number]]  Level of Care: Telemetry Medical [104]  May place patient in observation at [facility] or [place] if equivalent level of care is available:: No  Covid Evaluation: Asymptomatic - no recent exposure (last 10 days) testing not required  Diagnosis: SAH (subarachnoid hemorrhage) [facility]) [id number]  Admitting Physician: Vianne Bulls [id number]  Attending Physician: Vianne Bulls [id number]          B Medical/Surgery History Past Medical History:  Diagnosis Date   Cancer ([place])    History of blood clots    Hypertension    Hyponatremia 02/25/2022   Personal history of chemotherapy 1999   Past Surgical History:  Procedure Laterality Date   ABDOMINAL HYSTERECTOMY     BREAST EXCISIONAL BIOPSY Right 2002   BREAST EXCISIONAL BIOPSY Bilateral 05/08/1997   BREAST SURGERY     EYE SURGERY     MASTECTOMY Left 1999   ROTATOR CUFF REPAIR     spot removed from lung        A IV Location/Drains/Wounds Patient Lines/Drains/Airways Status     Active Line/Drains/Airways     Name Placement date Placement time Site Days   Peripheral IV 02/25/22 20 G Anterior;Right Forearm 02/25/22  1815  Forearm  1   Peripheral IV 02/25/22 Anterior;Left Forearm 02/25/22  1833  Forearm  1            Intake/Output Last 24 hours  Intake/Output Summary (Last 24 hours) at 02/26/2022 0032 Last data filed at 02/25/2022 2131 Gross per 24 hour  Intake 1000 ml  Output --  Net 1000 ml    Labs/Imaging Results for orders placed or performed during the hospital encounter of 02/25/22 (from the past 48 hour(s))  Comprehensive metabolic panel     Status: Abnormal   Collection Time: 02/25/22  6:17 PM  Result Value Ref Range   Sodium 128 (L) 135 - 145 mmol/L   Potassium 4.2 3.5 - 5.1 mmol/L   Chloride 93 (L) 98 - 111 mmol/L   CO2 24 22 - 32 mmol/L   Glucose, Bld 126 (H) 70 - 99 mg/dL    Comment: Glucose reference range applies only to samples taken after fasting for at least 8 hours.   BUN 12 8 - 23 mg/dL   Creatinine, Ser 0.87 0.44 - 1.00 mg/dL   Calcium 9.0 8.9 - 10.3 mg/dL   Total Protein 6.9 6.5 - 8.1 g/dL  Albumin 3.5 3.5 - 5.0 g/dL   AST 39 15 - 41 U/L   ALT 16 0 - 44 U/L   Alkaline Phosphatase 72 38 - 126 U/L   Total Bilirubin 0.9 0.3 - 1.2 mg/dL   GFR, Estimated >60 >60 mL/min    Comment: (NOTE) Calculated using the CKD-EPI Creatinine Equation (2021)    Anion gap 11 5 - 15    Comment: Performed at [facility] [address] [place], [place] [address]  CBC     Status: Abnormal   Collection Time: 02/25/22  6:17 PM  Result Value Ref Range   WBC 15.4 (H) 4.0 - 10.5 K/uL   RBC 4.58 3.87 - 5.11 MIL/uL   Hemoglobin 12.9 12.0 - 15.0 g/dL   HCT 40.2 36.0 - 46.0 %   MCV 87.8 80.0 - 100.0 fL   MCH 28.2 26.0 - 34.0 pg   MCHC 32.1 30.0 - 36.0 g/dL   RDW 15.0 11.5 - 15.5 %   Platelets 193 150 - 400 K/uL   nRBC 0.0 0.0 - 0.2 %    Comment: Performed at [facility], [place] [address] [place], [place] [address]  Lactic acid, plasma     Status: Abnormal   Collection Time: 02/25/22  6:17 PM  Result Value Ref Range   Lactic Acid, Venous 2.0 (HH) 0.5 - 1.9 mmol/L    Comment: CRITICAL RESULT CALLED TO, READ BACK BY AND VERIFIED WITH R,SMITH RN @1931$  02/25/22 E,BENTON Performed at [facility], [place] [address] [place], [place] [address]   Protime-INR     Status: Abnormal   Collection Time: 02/25/22  6:17 PM  Result Value Ref Range   Prothrombin Time 17.2 (H) 11.4 - 15.2 seconds   INR 1.4 (H) 0.8 - 1.2    Comment: (NOTE) INR goal varies based on device and disease states. Performed at [facility], [place] [address], [place], [place] [address]   CK     Status: None   Collection Time: 02/25/22  6:17 PM  Result Value Ref Range   Total CK 227 38 - 234 U/L    Comment: Performed at [facility], [place] [address], [place], [place] [address]  Sample to Blood Bank     Status: None   Collection Time: 02/25/22  6:20 PM  Result Value Ref Range   Blood Bank Specimen SAMPLE AVAILABLE FOR TESTING    Sample Expiration      02/26/2022,2359 Performed at [facility], [place] [address], [place], [place] [address]   I-Stat Chem 8, ED     Status: Abnormal   Collection Time: 02/25/22  6:32 PM  Result Value Ref Range   Sodium 129 (L) 135 - 145 mmol/L   Potassium 4.1 3.5 - 5.1 mmol/L   Chloride 94 (L) 98 - 111 mmol/L   BUN 14 8 - 23 mg/dL   Creatinine, Ser 0.70 0.44 - 1.00 mg/dL   Glucose, Bld 124 (H) 70 - 99 mg/dL    Comment: Glucose reference range applies only to samples taken after fasting for at least 8 hours.   Calcium, Ion 1.07 (L) 1.15 - 1.40 mmol/L   TCO2 24 22 - 32 mmol/L   Hemoglobin 14.3 12.0 - 15.0 g/dL   HCT 42.0 36.0 - 46.0 %  Urinalysis, Routine w reflex microscopic -Urine, Clean Catch     Status: Abnormal   Collection Time: 02/25/22  9:20 PM  Result Value  Ref Range   Color, Urine STRAW (A) YELLOW   APPearance CLEAR  CLEAR   Specific Gravity, Urine 1.023 1.005 - 1.030   pH 7.0 5.0 - 8.0   Glucose, UA NEGATIVE NEGATIVE mg/dL   Hgb urine dipstick SMALL (A) NEGATIVE   Bilirubin Urine NEGATIVE NEGATIVE   Ketones, ur 5 (A) NEGATIVE mg/dL   Protein, ur NEGATIVE NEGATIVE mg/dL   Nitrite NEGATIVE NEGATIVE   Leukocytes,Ua NEGATIVE NEGATIVE   RBC / HPF 0-5 0 - 5 RBC/hpf   WBC, UA 0-5 0 - 5 WBC/hpf   Bacteria, UA NONE SEEN NONE SEEN   Squamous Epithelial / HPF 0-5 0 - 5 /HPF    Comment: Performed at [facility], [place] [address] [place], [place] [address]  Lactic acid, plasma     Status: None   Collection Time: 02/25/22  9:20 PM  Result Value Ref Range   Lactic Acid, Venous 1.4 0.5 - 1.9 mmol/L    Comment: Performed at [facility] [address], [place], [place] [address]   DG Wrist Complete Left  Result Date: 02/25/2022 CLINICAL DATA:  Fall EXAM: LEFT WRIST - COMPLETE 3+ VIEW COMPARISON:  None Available. FINDINGS: The bones are osteopenic. There is an acute transverse fracture through the distal radius, mildly impacted. There is 7 mm of posterior offset along the posterior aspect of the fracture. There is an acute nondisplaced fracture through the distal ulna which is nondisplaced. Nondisplaced ulnar styloid fracture is present. There are degenerative changes of the wrist with radiocarpal and ulnocarpal joint space narrowing and calcification of the triangular fibrocartilage. There is soft tissue swelling surrounding the wrist. IMPRESSION: 1. Acute transverse fracture through the distal radius. 2. Acute nondisplaced fracture through the distal ulna. 3. Nondisplaced ulnar styloid fracture. Electronically Signed   By: Ronney Asters M.D.   On: 02/25/2022 23:14   CT MAXILLOFACIAL WO CONTRAST  Result Date: 02/25/2022 CLINICAL DATA:  Initial evaluation for acute trauma, fall. EXAM: CT MAXILLOFACIAL WITHOUT CONTRAST TECHNIQUE: Multidetector CT imaging of the maxillofacial structures was performed.  Multiplanar CT image reconstructions were also generated. RADIATION DOSE REDUCTION: This exam was performed according to the departmental dose-optimization program which includes automated exposure control, adjustment of the mA and/or kV according to patient size and/or use of iterative reconstruction technique. COMPARISON:  None Available. FINDINGS: Osseous: Subtle lucency extends through the left cycle medic arch (series 6, image 75), somewhat age indeterminate, but presumably acute. There are acute minimally displaced fractures extending through the posterior wall of the left maxillary sinus. Additional acute nondisplaced fracture extends through the anterior wall. Associated fractures of the left orbital floor with up to 2 mm of depression (series 9, image 31). Comminuted and mildly angulated fractures of the lateral left orbital wall noted as well. Left lamina per appreciate intact. No acute osseous abnormality about the contralateral right orbit. Nasal bones intact. Right-to-left nasal septal deviation without fracture. Mandible intact. Mandibular condyles normally situated. No acute abnormality about the dentition. Orbits: Fractures involving the left orbital floor and lateral left orbital wall as above. Small amount of hemorrhage present within the extraconal fat of the inferior left orbit. Globes intact. Prior ocular lens replacement. Sinuses: Hemosinus within the left maxillary sinus. Scattered mucosal thickening present within the sphenoid ethmoidal sinuses. Moderate left mastoid effusion, partially visualized. Soft tissues: Acute left periorbital and facial contusion. No frank hematoma. Limited intracranial: Small focus of acute hemorrhage noted at the anterior/inferior left frontal lobe (series 4, image 71). Finding  better characterized on corresponding head CT. IMPRESSION: 1. Acute left facial tripod fracture as detailed above. 2. Small volume hemorrhage within the inferior bony left orbit. No  retro-orbital hematoma. Globes intact. 3. Overlying acute left periorbital and facial contusion. 4. Small focus of acute hemorrhage at the anterior/inferior left frontal lobe, better characterized on corresponding head CT. Electronically Signed   By: Jeannine Boga M.D.   On: 02/25/2022 19:52   CT CHEST ABDOMEN PELVIS W CONTRAST  Result Date: 02/25/2022 CLINICAL DATA:  Polytrauma, blunt [id number] Trauma [id number] EXAM: CT CHEST, ABDOMEN, AND PELVIS WITH CONTRAST TECHNIQUE: Multidetector CT imaging of the chest, abdomen and pelvis was performed following the standard protocol during bolus administration of intravenous contrast. RADIATION DOSE REDUCTION: This exam was performed according to the departmental dose-optimization program which includes automated exposure control, adjustment of the mA and/or kV according to patient size and/or use of iterative reconstruction technique. CONTRAST:  3m OMNIPAQUE IOHEXOL 350 MG/ML SOLN COMPARISON:  CT abdomen pelvis 02/16/2004 FINDINGS: CHEST: Cardiovascular: No aortic injury. The thoracic aorta is normal in caliber. The heart is normal in size. No significant pericardial effusion. Likely mitral annular calcifications. Aortic valve leaflet calcifications. Moderate atherosclerotic plaque of the aorta. Left anterior descending coronary calcification. The main pulmonary artery is normal in caliber. No central pulmonary embolus. Mediastinum/Nodes: No pneumomediastinum. No mediastinal hematoma. The esophagus is unremarkable.  Small hiatal hernia. Subcentimeter thyroid hypodense nodules. Not clinically significant; no follow-up imaging recommended (ref: J Am Coll Radiol. 2015 Feb;12(2): 143-50). The central airways are patent. No mediastinal, hilar, or axillary lymphadenopathy. Lungs/Pleura: No focal consolidation. No pulmonary nodule. No pulmonary mass. No pulmonary contusion or laceration. No pneumatocele formation. No pleural effusion. No pneumothorax. No hemothorax.  Musculoskeletal/Chest wall: No chest wall mass.  Left mastectomy. Diffusely decreased bone density. No acute rib or sternal fracture. Old healed bilateral rib fractures. No spinal fracture. Rotator cuff anchor suture along the left humeral head. Severe degenerative changes of the bilateral shoulders. ABDOMEN / PELVIS: Hepatobiliary: Not enlarged. No focal lesion. No laceration or subcapsular hematoma. Status post cholecystectomy.  Pneumobilia. Pancreas: Normal pancreatic contour. No main pancreatic duct dilatation. Spleen: Not enlarged. No focal lesion. No laceration, subcapsular hematoma, or vascular injury. Adrenals/Urinary Tract: No nodularity bilaterally. Bilateral kidneys enhance symmetrically. No hydronephrosis. No contusion, laceration, or subcapsular hematoma. Subcentimeter hypodensities too small to characterize-no further follow-up indicated. No injury to the vascular structures or collecting systems. No hydroureter. The urinary bladder is unremarkable. Stomach/Bowel: No small or large bowel wall thickening or dilatation. Diffuse sigmoid diverticulosis. Otherwise scattered colonic diverticulosis. The appendix is not definitely identified with no inflammatory changes in the right lower quadrant to suggest acute appendicitis. Vasculature/Lymphatics: No abdominal aorta or iliac aneurysm. No active contrast extravasation or pseudoaneurysm. No abdominal, pelvic, inguinal lymphadenopathy. Reproductive: Normal. Other: No simple free fluid ascites. No pneumoperitoneum. No hemoperitoneum. No mesenteric hematoma identified. No organized fluid collection. Musculoskeletal: No significant soft tissue hematoma. Bilateral small fat containing inguinal hernias. Diffusely decreased bone density. No acute pelvic fracture. Old healed right hip fracture. No spinal fracture. Grade 2 anterolisthesis of L5 on S1. Grade 1 anterolisthesis of L4 on L5. Ports and Devices: None. IMPRESSION: 1. No acute traumatic injury to the chest,  abdomen, or pelvis. 2. No acute fracture or traumatic malalignment of the thoracic or lumbar spine. 3. Other imaging findings of potential clinical significance: Colonic diverticulosis with no acute diverticulitis. Bilateral fat containing inguinal hernias. Aortic Atherosclerosis (ICD10-I70.0) including likely mitral annular, coronary artery, as well as aortic valve leaflet  calcifications-correlate for aortic stenosis. Electronically Signed   By: Iven Finn M.D.   On: 02/25/2022 19:51   CT CERVICAL SPINE WO CONTRAST  Result Date: 02/25/2022 CLINICAL DATA:  Unwitnessed fall EXAM: CT CERVICAL SPINE WITHOUT CONTRAST TECHNIQUE: Multidetector CT imaging of the cervical spine was performed without intravenous contrast. Multiplanar CT image reconstructions were also generated. RADIATION DOSE REDUCTION: This exam was performed according to the departmental dose-optimization program which includes automated exposure control, adjustment of the mA and/or kV according to patient size and/or use of iterative reconstruction technique. COMPARISON:  None Available. FINDINGS: Alignment: Normal. Skull base and vertebrae: Craniocervical alignment is normal. The atlantodental interval is not widened. No acute fracture of the cervical spine. No lytic or blastic bone lesion. Soft tissues and spinal canal: No prevertebral fluid or swelling. No visible canal hematoma. There is extensive atherosclerotic calcification within the carotid bifurcations bilaterally. Disc levels: There is intervertebral disc space narrowing and endplate remodeling throughout the cervical spine, most severe at C5-C7 in keeping with changes of mild to moderate degenerative disc disease. Prevertebral soft tissues are not thickened on sagittal reformats. Spinal canal is widely patent. Multilevel uncovertebral and facet arthrosis results in mild multilevel neuroforaminal narrowing, most severe at C5-C7. Upper chest: Negative. Other: None IMPRESSION: 1. No  acute fracture or listhesis of the cervical spine. 2. Extensive atherosclerotic calcification within the carotid bifurcations bilaterally. If indicated, the degree of stenosis would be better assessed with dedicated carotid artery Doppler sonography. Electronically Signed   By: Fidela Salisbury M.D.   On: 02/25/2022 19:42   CT HEAD WO CONTRAST  Result Date: 02/25/2022 CLINICAL DATA:  Trauma EXAM: CT HEAD WITHOUT CONTRAST TECHNIQUE: Contiguous axial images were obtained from the base of the skull through the vertex without intravenous contrast. RADIATION DOSE REDUCTION: This exam was performed according to the departmental dose-optimization program which includes automated exposure control, adjustment of the mA and/or kV according to patient size and/or use of iterative reconstruction technique. COMPARISON:  None Available. FINDINGS: Brain: There is a very small amount of subarachnoid hemorrhage in the anterior left frontal lobe image 4/16 and 4/20 there is no mass effect, midline shift or extra-axial fluid collection. There is no acute infarct. There is mild diffuse atrophy. There is moderate periventricular and deep white matter hypodensity, likely chronic small vessel ischemic change. Vascular: Atherosclerotic calcifications are present within the cavernous internal carotid arteries. Skull: Negative for skull fracture. Fractures of the left maxilla and left orbit noted. Please see dedicated CT maxillofacial for further description. Sinuses/Orbits: There is hemorrhage/air-fluid level in the left maxillary sinus. Bilateral mastoid effusions are present. Other: Left periorbital swelling. IMPRESSION: 1. Very small amount of subarachnoid hemorrhage in the anterior left frontal lobe. No mass effect or midline shift. 2. Atrophy and chronic small vessel ischemic changes. 3. Fractures of the left maxilla and left orbit. Please see dedicated CT maxillofacial for further description. Electronically Signed   By: Ronney Asters M.D.   On: 02/25/2022 19:39   DG Humerus Left  Result Date: 02/25/2022 CLINICAL DATA:  Trauma to the left upper extremity. EXAM: LEFT SHOULDER; LEFT HUMERUS - 2+ VIEW COMPARISON:  Chest radiograph dated 02/25/2022. FINDINGS: No acute fracture or dislocation of the left shoulder. The bones are osteopenic. There is degenerative changes with calcific tendinopathy of the supraspinatus muscle. Humeral head anchor pins noted. Multiple old appearing left rib fractures. Several surgical clips noted over the left axilla. IMPRESSION: 1. No acute fracture or dislocation of the left shoulder or left  humerus. 2. Osteopenia and degenerative changes. Electronically Signed   By: Anner Crete M.D.   On: 02/25/2022 18:52   DG Shoulder Left Port  Result Date: 02/25/2022 CLINICAL DATA:  Trauma to the left upper extremity. EXAM: LEFT SHOULDER; LEFT HUMERUS - 2+ VIEW COMPARISON:  Chest radiograph dated 02/25/2022. FINDINGS: No acute fracture or dislocation of the left shoulder. The bones are osteopenic. There is degenerative changes with calcific tendinopathy of the supraspinatus muscle. Humeral head anchor pins noted. Multiple old appearing left rib fractures. Several surgical clips noted over the left axilla. IMPRESSION: 1. No acute fracture or dislocation of the left shoulder or left humerus. 2. Osteopenia and degenerative changes. Electronically Signed   By: Anner Crete M.D.   On: 02/25/2022 18:52   DG Chest Port 1 View  Result Date: 02/25/2022 CLINICAL DATA:  Trauma EXAM: PORTABLE CHEST 1 VIEW COMPARISON:  12/20/2021, 12/13/2021 FINDINGS: Heart size is stable. No focal airspace consolidation, pleural effusion, or pneumothorax. Multiple chronic bilateral rib fractures. No definite acute rib fracture is seen. IMPRESSION: 1. No acute findings within the chest. 2. Multiple chronic bilateral rib fractures. No definite acute rib fracture is seen. Electronically Signed   By: Davina Poke D.O.   On:  02/25/2022 18:36   DG Pelvis Portable  Result Date: 02/25/2022 CLINICAL DATA:  Trauma. EXAM: PORTABLE PELVIS 1-2 VIEWS COMPARISON:  None Available. FINDINGS: No fracture.  No bone lesion. Hip joints, SI joints and pubic symphysis are normally aligned. Skeletal structures are demineralized. Soft tissues are unremarkable. IMPRESSION: No fracture or acute finding. Electronically Signed   By: Lajean Manes M.D.   On: 02/25/2022 18:36    Pending Labs Unresulted Labs (From admission, onward)     Start     Ordered   02/26/22 [id number]  Basic metabolic panel  Tomorrow morning,   R        02/25/22 2308   02/26/22 0500  CBC  Tomorrow morning,   R        02/25/22 2308            Vitals/Pain Today's Vitals   02/25/22 2315 02/25/22 2330 02/25/22 2338 02/25/22 2338  BP: (!) 108/58 (!) 94/56    Pulse: 66 65    Resp: 17 17    Temp:   97.9 F (36.6 C)   TempSrc:   Oral   SpO2: 96% 97%    Weight:      Height:      PainSc:    Asleep    Isolation Precautions No active isolations  Medications Medications  Tdap (BOOSTRIX) 5-2.5-18.5 LF-MCG/0.5 injection ( Intramuscular Canceled Entry 02/25/22 1902)  Tdap (BOOSTRIX) 5-2.5-18.5 LF-MCG/0.5 injection ( Intramuscular Canceled Entry 02/25/22 1903)  amLODipine (NORVASC) tablet 2.5 mg (has no administration in time range)  lisinopril (ZESTRIL) tablet 40 mg (has no administration in time range)  pravastatin (PRAVACHOL) tablet 40 mg (has no administration in time range)  metoprolol tartrate (LOPRESSOR) tablet 50 mg (has no administration in time range)  pantoprazole (PROTONIX) EC tablet 40 mg (has no administration in time range)  sodium chloride flush (NS) 0.9 % injection 3 mL (has no administration in time range)  acetaminophen (TYLENOL) tablet 650 mg (has no administration in time range)    Or  acetaminophen (TYLENOL) suppository 650 mg (has no administration in time range)  oxyCODONE (Oxy IR/ROXICODONE) immediate release tablet 2.5 mg (has no  administration in time range)  fentaNYL (SUBLIMAZE) injection 12.5-25 mcg (has no administration in time range)  polyethylene  glycol (MIRALAX / GLYCOLAX) packet 17 g (has no administration in time range)  bisacodyl (DULCOLAX) EC tablet 5 mg (has no administration in time range)  ondansetron (ZOFRAN) tablet 4 mg (has no administration in time range)    Or  ondansetron (ZOFRAN) injection 4 mg (has no administration in time range)  sodium chloride 0.9 % bolus 1,000 mL (0 mLs Intravenous Stopped 02/25/22 2131)  lidocaine (PF) (XYLOCAINE) 1 % injection 5 mL (5 mLs Infiltration Given 02/25/22 2234)  Tdap (BOOSTRIX) injection 0.5 mL (0.5 mLs Intramuscular Given 02/25/22 1904)  iohexol (OMNIPAQUE) 350 MG/ML injection 75 mL (75 mLs Intravenous Contrast Given 02/25/22 1932)  acetaminophen (TYLENOL) tablet 650 mg (650 mg Oral Given 02/25/22 2240)    Mobility walks with person assist     Focused Assessments Neuro Assessment Handoff:  Swallow screen pass? Yes  Cardiac Rhythm: Normal sinus rhythm       Neuro Assessment: Within Defined Limits Neuro Checks:      Has TPA been given? No If patient is a Neuro Trauma and patient is going to OR before floor call report to [place] nurse: [phone] or [phone]   R Recommendations: See Admitting Provider Note  Report given to:   Additional Notes: Probably discharge to inpatient rehab. A/O x 4. Family requests calls with any updates 24/7

## 2022-02-26 NOTE — Assessment & Plan Note (Signed)
-   Continue amlodipine, lisinopril, metoprolol

## 2022-02-26 NOTE — Assessment & Plan Note (Signed)
Patient able to recount to me in detail her fall, was mechanical in nature, no LOC/syncope/near syncope.

## 2022-02-26 NOTE — Hospital Course (Signed)
Mrs. Stephanie Pearson is a 89 y.o. F lives alone, hx HTN, DVT on Xarelto who fell in her kitchen.  In the ER, found to have small SAH, fracture of facial tripod and wrist fracture.  She has been placed in a sugar-tong and a sling. The plan is for to discharge to home with home health. She is not to blow her nose, and she is to follow up with ophthalmology as outpatient. She has plans to follow up with Dr. Lucianne Lei. Xarelto has been held and will be held at discharge.   She was to have discharged to home today, but when she was gotten up to walk in the halls, she became dizzy. She was found to be hypotensive with a systolic of 83 and a heart rate in the 30's and 40's. A rapid response was called. She was given a 500 cc bolus with improvement in her SP. Heart rate is now 109.  Will decrease metoprolol to 25 mg and gie cautious continuous fluids. Will try to walk in halls again tomorrow.

## 2022-02-26 NOTE — Assessment & Plan Note (Signed)
-   No nose blowing - Analgesics as needed - Follow up with Ophthalmology after discharge, Dr. Lucianne Lei info in discharge instructions

## 2022-02-26 NOTE — Assessment & Plan Note (Signed)
Mild, asymptomatic.  No work up needed

## 2022-02-26 NOTE — Progress Notes (Signed)
Orthopedic Tech Progress Note Patient Details:  Stephanie Pearson 11/25/1927 [id number]  Ortho Devices Type of Ortho Device: Arm sling, Sugartong splint Ortho Device/Splint Location: lue Ortho Device/Splint Interventions: Ordered, Application, Adjustment   Post Interventions Patient Tolerated: Well Instructions Provided: Care of device, Adjustment of device  Karolee Stamps 02/26/2022, 12:17 AM

## 2022-02-26 NOTE — Progress Notes (Addendum)
CSW spoke with patient's daughter Olegario Shearer to discuss recommended discharge plan of home with home health services. Vicky reports she and her husband are the patient's primary caregivers. Vicky reports patient was fully independently prior to hospitalization but the fall she had ended with a broken leg arm which is her dominant hand. Vicky reports she will plan to take the patient home upon discharge. Vicky reports patient's [facility] is [facility]. Olegario Shearer states she will reach out to CSW if additional needs or questions arise.  Madilyn Fireman, MSW, LCSW Transitions of Care  Clinical Social Worker II [phone]

## 2022-02-26 NOTE — Assessment & Plan Note (Addendum)
Neurosurgery consulted.  They recommend repeat CTH if neurological changes, otherwise no specific follow up needed.  So far, she is neurologically intact, no changes. - Hold home Xarelto - Defer timing of restart to PCP after discharge -- Will CT head again due to episode of bradycardia and dizzyness with walking today.

## 2022-02-26 NOTE — Assessment & Plan Note (Signed)
-   Maintain in sugartong and sling - Follow up with Dr. Percell Miller in 1 week

## 2022-02-26 NOTE — Assessment & Plan Note (Signed)
-   Hold Xarelto at discharge

## 2022-02-26 NOTE — Progress Notes (Signed)
Assessment & Plan: HD#2 - 89 y.o. female fall on level ground  Small subarachnoid hemorrhage frontal lobe  - continue anticoagulation per neurosurgery  - no repeat CT unless change in neuro status Left facial tripod fractures  - ENT consulted  - no nose blowing  - outpatient follow up Left wrist distal radius and ulna fractures  - splinted in ER  - will need hand surgery consult Left facial laceration  - sutured in ER  History of DVT on Xarelto Hyponatremia  PT and OT consults  Disposition - await PT/OT recommendations, family wishes.  Will follow.        Armandina Gemma, MD [facility] [place] practice Office: [phone]        Chief Complaint: Fall  Subjective: Patient up in chair, pleasant.  Ate regular breakfast.  Family in room.  Objective: Vital signs in last 24 hours: Temp:  [97 F (36.1 C)-97.9 F (36.6 C)] 97.8 F (36.6 C) (02/11 0758) Pulse Rate:  [61-90] 62 (02/11 0758) Resp:  [13-28] 17 (02/11 0758) BP: (94-152)/(54-100) 125/55 (02/11 0758) SpO2:  [96 %-100 %] 100 % (02/11 0758) Weight:  [59.7 kg] 59.7 kg (02/10 1820) Last BM Date :  (pt could not remember and mentioned that her stool softner is not helping anymore.)  Intake/Output from previous day: 02/10 0701 - 02/11 0700 In: 1000 [IV Piggyback:1000] Out: -  Intake/Output this shift: No intake/output data recorded.  Physical Exam: HEENT - ecchymosis, STS left orbit; <1cm lac left eyebrow, sutured Abdomen - soft, non-tender Ext - splint and sling LUE Neuro - alert & oriented, no focal deficits grossly  Lab Results:  Recent Labs    02/25/22 1817 02/25/22 1832 02/26/22 0141  WBC 15.4*  --  10.2  HGB 12.9 14.3 10.6*  HCT 40.2 42.0 31.6*  PLT 193  --  179   BMET Recent Labs    02/25/22 1817 02/25/22 1832 02/26/22 0141  NA 128* 129* 131*  K 4.2 4.1 3.4*  CL 93* 94* 99  CO2 24  --  21*  GLUCOSE 126* 124* 111*  BUN 12 14 10  $ CREATININE 0.87 0.70 0.84   CALCIUM 9.0  --  8.4*   PT/INR Recent Labs    02/25/22 1817  LABPROT 17.2*  INR 1.4*   Comprehensive Metabolic Panel:    Component Value Date/Time   NA 131 (L) 02/26/2022 0141   NA 129 (L) 02/25/2022 1832   K 3.4 (L) 02/26/2022 0141   K 4.1 02/25/2022 1832   CL 99 02/26/2022 0141   CL 94 (L) 02/25/2022 1832   CO2 21 (L) 02/26/2022 0141   CO2 24 02/25/2022 1817   BUN 10 02/26/2022 0141   BUN 14 02/25/2022 1832   CREATININE 0.84 02/26/2022 0141   CREATININE 0.70 02/25/2022 1832   GLUCOSE 111 (H) 02/26/2022 0141   GLUCOSE 124 (H) 02/25/2022 1832   CALCIUM 8.4 (L) 02/26/2022 0141   CALCIUM 9.0 02/25/2022 1817   AST 39 02/25/2022 1817   AST 23 03/09/2009 2025   ALT 16 02/25/2022 1817   ALT 15 03/09/2009 2025   ALKPHOS 72 02/25/2022 1817   ALKPHOS 75 03/09/2009 2025   BILITOT 0.9 02/25/2022 1817   BILITOT 0.6 03/09/2009 2025   PROT 6.9 02/25/2022 1817   PROT 6.8 03/09/2009 2025   ALBUMIN 3.5 02/25/2022 1817   ALBUMIN 3.4 (L) 03/09/2009 2025    Studies/Results: DG Wrist Complete Left  Result Date: 02/25/2022 CLINICAL DATA:  Fall EXAM:  LEFT WRIST - COMPLETE 3+ VIEW COMPARISON:  None Available. FINDINGS: The bones are osteopenic. There is an acute transverse fracture through the distal radius, mildly impacted. There is 7 mm of posterior offset along the posterior aspect of the fracture. There is an acute nondisplaced fracture through the distal ulna which is nondisplaced. Nondisplaced ulnar styloid fracture is present. There are degenerative changes of the wrist with radiocarpal and ulnocarpal joint space narrowing and calcification of the triangular fibrocartilage. There is soft tissue swelling surrounding the wrist. IMPRESSION: 1. Acute transverse fracture through the distal radius. 2. Acute nondisplaced fracture through the distal ulna. 3. Nondisplaced ulnar styloid fracture. Electronically Signed   By: Ronney Asters M.D.   On: 02/25/2022 23:14   CT MAXILLOFACIAL WO  CONTRAST  Result Date: 02/25/2022 CLINICAL DATA:  Initial evaluation for acute trauma, fall. EXAM: CT MAXILLOFACIAL WITHOUT CONTRAST TECHNIQUE: Multidetector CT imaging of the maxillofacial structures was performed. Multiplanar CT image reconstructions were also generated. RADIATION DOSE REDUCTION: This exam was performed according to the departmental dose-optimization program which includes automated exposure control, adjustment of the mA and/or kV according to patient size and/or use of iterative reconstruction technique. COMPARISON:  None Available. FINDINGS: Osseous: Subtle lucency extends through the left cycle medic arch (series 6, image 56), somewhat age indeterminate, but presumably acute. There are acute minimally displaced fractures extending through the posterior wall of the left maxillary sinus. Additional acute nondisplaced fracture extends through the anterior wall. Associated fractures of the left orbital floor with up to 2 mm of depression (series 9, image 31). Comminuted and mildly angulated fractures of the lateral left orbital wall noted as well. Left lamina per appreciate intact. No acute osseous abnormality about the contralateral right orbit. Nasal bones intact. Right-to-left nasal septal deviation without fracture. Mandible intact. Mandibular condyles normally situated. No acute abnormality about the dentition. Orbits: Fractures involving the left orbital floor and lateral left orbital wall as above. Small amount of hemorrhage present within the extraconal fat of the inferior left orbit. Globes intact. Prior ocular lens replacement. Sinuses: Hemosinus within the left maxillary sinus. Scattered mucosal thickening present within the sphenoid ethmoidal sinuses. Moderate left mastoid effusion, partially visualized. Soft tissues: Acute left periorbital and facial contusion. No frank hematoma. Limited intracranial: Small focus of acute hemorrhage noted at the anterior/inferior left frontal lobe  (series 4, image 71). Finding better characterized on corresponding head CT. IMPRESSION: 1. Acute left facial tripod fracture as detailed above. 2. Small volume hemorrhage within the inferior bony left orbit. No retro-orbital hematoma. Globes intact. 3. Overlying acute left periorbital and facial contusion. 4. Small focus of acute hemorrhage at the anterior/inferior left frontal lobe, better characterized on corresponding head CT. Electronically Signed   By: Jeannine Boga M.D.   On: 02/25/2022 19:52   CT CHEST ABDOMEN PELVIS W CONTRAST  Result Date: 02/25/2022 CLINICAL DATA:  Polytrauma, blunt [id number] Trauma [id number] EXAM: CT CHEST, ABDOMEN, AND PELVIS WITH CONTRAST TECHNIQUE: Multidetector CT imaging of the chest, abdomen and pelvis was performed following the standard protocol during bolus administration of intravenous contrast. RADIATION DOSE REDUCTION: This exam was performed according to the departmental dose-optimization program which includes automated exposure control, adjustment of the mA and/or kV according to patient size and/or use of iterative reconstruction technique. CONTRAST:  25m OMNIPAQUE IOHEXOL 350 MG/ML SOLN COMPARISON:  CT abdomen pelvis 02/16/2004 FINDINGS: CHEST: Cardiovascular: No aortic injury. The thoracic aorta is normal in caliber. The heart is normal in size. No significant pericardial effusion. Likely mitral annular calcifications.  Aortic valve leaflet calcifications. Moderate atherosclerotic plaque of the aorta. Left anterior descending coronary calcification. The main pulmonary artery is normal in caliber. No central pulmonary embolus. Mediastinum/Nodes: No pneumomediastinum. No mediastinal hematoma. The esophagus is unremarkable.  Small hiatal hernia. Subcentimeter thyroid hypodense nodules. Not clinically significant; no follow-up imaging recommended (ref: J Am Coll Radiol. 2015 Feb;12(2): 143-50). The central airways are patent. No mediastinal, hilar, or axillary  lymphadenopathy. Lungs/Pleura: No focal consolidation. No pulmonary nodule. No pulmonary mass. No pulmonary contusion or laceration. No pneumatocele formation. No pleural effusion. No pneumothorax. No hemothorax. Musculoskeletal/Chest wall: No chest wall mass.  Left mastectomy. Diffusely decreased bone density. No acute rib or sternal fracture. Old healed bilateral rib fractures. No spinal fracture. Rotator cuff anchor suture along the left humeral head. Severe degenerative changes of the bilateral shoulders. ABDOMEN / PELVIS: Hepatobiliary: Not enlarged. No focal lesion. No laceration or subcapsular hematoma. Status post cholecystectomy.  Pneumobilia. Pancreas: Normal pancreatic contour. No main pancreatic duct dilatation. Spleen: Not enlarged. No focal lesion. No laceration, subcapsular hematoma, or vascular injury. Adrenals/Urinary Tract: No nodularity bilaterally. Bilateral kidneys enhance symmetrically. No hydronephrosis. No contusion, laceration, or subcapsular hematoma. Subcentimeter hypodensities too small to characterize-no further follow-up indicated. No injury to the vascular structures or collecting systems. No hydroureter. The urinary bladder is unremarkable. Stomach/Bowel: No small or large bowel wall thickening or dilatation. Diffuse sigmoid diverticulosis. Otherwise scattered colonic diverticulosis. The appendix is not definitely identified with no inflammatory changes in the right lower quadrant to suggest acute appendicitis. Vasculature/Lymphatics: No abdominal aorta or iliac aneurysm. No active contrast extravasation or pseudoaneurysm. No abdominal, pelvic, inguinal lymphadenopathy. Reproductive: Normal. Other: No simple free fluid ascites. No pneumoperitoneum. No hemoperitoneum. No mesenteric hematoma identified. No organized fluid collection. Musculoskeletal: No significant soft tissue hematoma. Bilateral small fat containing inguinal hernias. Diffusely decreased bone density. No acute pelvic  fracture. Old healed right hip fracture. No spinal fracture. Grade 2 anterolisthesis of L5 on S1. Grade 1 anterolisthesis of L4 on L5. Ports and Devices: None. IMPRESSION: 1. No acute traumatic injury to the chest, abdomen, or pelvis. 2. No acute fracture or traumatic malalignment of the thoracic or lumbar spine. 3. Other imaging findings of potential clinical significance: Colonic diverticulosis with no acute diverticulitis. Bilateral fat containing inguinal hernias. Aortic Atherosclerosis (ICD10-I70.0) including likely mitral annular, coronary artery, as well as aortic valve leaflet calcifications-correlate for aortic stenosis. Electronically Signed   By: Iven Finn M.D.   On: 02/25/2022 19:51   CT CERVICAL SPINE WO CONTRAST  Result Date: 02/25/2022 CLINICAL DATA:  Unwitnessed fall EXAM: CT CERVICAL SPINE WITHOUT CONTRAST TECHNIQUE: Multidetector CT imaging of the cervical spine was performed without intravenous contrast. Multiplanar CT image reconstructions were also generated. RADIATION DOSE REDUCTION: This exam was performed according to the departmental dose-optimization program which includes automated exposure control, adjustment of the mA and/or kV according to patient size and/or use of iterative reconstruction technique. COMPARISON:  None Available. FINDINGS: Alignment: Normal. Skull base and vertebrae: Craniocervical alignment is normal. The atlantodental interval is not widened. No acute fracture of the cervical spine. No lytic or blastic bone lesion. Soft tissues and spinal canal: No prevertebral fluid or swelling. No visible canal hematoma. There is extensive atherosclerotic calcification within the carotid bifurcations bilaterally. Disc levels: There is intervertebral disc space narrowing and endplate remodeling throughout the cervical spine, most severe at C5-C7 in keeping with changes of mild to moderate degenerative disc disease. Prevertebral soft tissues are not thickened on sagittal  reformats. Spinal canal is widely patent.  Multilevel uncovertebral and facet arthrosis results in mild multilevel neuroforaminal narrowing, most severe at C5-C7. Upper chest: Negative. Other: None IMPRESSION: 1. No acute fracture or listhesis of the cervical spine. 2. Extensive atherosclerotic calcification within the carotid bifurcations bilaterally. If indicated, the degree of stenosis would be better assessed with dedicated carotid artery Doppler sonography. Electronically Signed   By: Fidela Salisbury M.D.   On: 02/25/2022 19:42   CT HEAD WO CONTRAST  Result Date: 02/25/2022 CLINICAL DATA:  Trauma EXAM: CT HEAD WITHOUT CONTRAST TECHNIQUE: Contiguous axial images were obtained from the base of the skull through the vertex without intravenous contrast. RADIATION DOSE REDUCTION: This exam was performed according to the departmental dose-optimization program which includes automated exposure control, adjustment of the mA and/or kV according to patient size and/or use of iterative reconstruction technique. COMPARISON:  None Available. FINDINGS: Brain: There is a very small amount of subarachnoid hemorrhage in the anterior left frontal lobe image 4/16 and 4/20 there is no mass effect, midline shift or extra-axial fluid collection. There is no acute infarct. There is mild diffuse atrophy. There is moderate periventricular and deep white matter hypodensity, likely chronic small vessel ischemic change. Vascular: Atherosclerotic calcifications are present within the cavernous internal carotid arteries. Skull: Negative for skull fracture. Fractures of the left maxilla and left orbit noted. Please see dedicated CT maxillofacial for further description. Sinuses/Orbits: There is hemorrhage/air-fluid level in the left maxillary sinus. Bilateral mastoid effusions are present. Other: Left periorbital swelling. IMPRESSION: 1. Very small amount of subarachnoid hemorrhage in the anterior left frontal lobe. No mass effect or  midline shift. 2. Atrophy and chronic small vessel ischemic changes. 3. Fractures of the left maxilla and left orbit. Please see dedicated CT maxillofacial for further description. Electronically Signed   By: Ronney Asters M.D.   On: 02/25/2022 19:39   DG Humerus Left  Result Date: 02/25/2022 CLINICAL DATA:  Trauma to the left upper extremity. EXAM: LEFT SHOULDER; LEFT HUMERUS - 2+ VIEW COMPARISON:  Chest radiograph dated 02/25/2022. FINDINGS: No acute fracture or dislocation of the left shoulder. The bones are osteopenic. There is degenerative changes with calcific tendinopathy of the supraspinatus muscle. Humeral head anchor pins noted. Multiple old appearing left rib fractures. Several surgical clips noted over the left axilla. IMPRESSION: 1. No acute fracture or dislocation of the left shoulder or left humerus. 2. Osteopenia and degenerative changes. Electronically Signed   By: Anner Crete M.D.   On: 02/25/2022 18:52   DG Shoulder Left Port  Result Date: 02/25/2022 CLINICAL DATA:  Trauma to the left upper extremity. EXAM: LEFT SHOULDER; LEFT HUMERUS - 2+ VIEW COMPARISON:  Chest radiograph dated 02/25/2022. FINDINGS: No acute fracture or dislocation of the left shoulder. The bones are osteopenic. There is degenerative changes with calcific tendinopathy of the supraspinatus muscle. Humeral head anchor pins noted. Multiple old appearing left rib fractures. Several surgical clips noted over the left axilla. IMPRESSION: 1. No acute fracture or dislocation of the left shoulder or left humerus. 2. Osteopenia and degenerative changes. Electronically Signed   By: Anner Crete M.D.   On: 02/25/2022 18:52   DG Chest Port 1 View  Result Date: 02/25/2022 CLINICAL DATA:  Trauma EXAM: PORTABLE CHEST 1 VIEW COMPARISON:  12/20/2021, 12/13/2021 FINDINGS: Heart size is stable. No focal airspace consolidation, pleural effusion, or pneumothorax. Multiple chronic bilateral rib fractures. No definite acute rib  fracture is seen. IMPRESSION: 1. No acute findings within the chest. 2. Multiple chronic bilateral rib fractures. No definite acute  rib fracture is seen. Electronically Signed   By: Davina Poke D.O.   On: 02/25/2022 18:36   DG Pelvis Portable  Result Date: 02/25/2022 CLINICAL DATA:  Trauma. EXAM: PORTABLE PELVIS 1-2 VIEWS COMPARISON:  None Available. FINDINGS: No fracture.  No bone lesion. Hip joints, SI joints and pubic symphysis are normally aligned. Skeletal structures are demineralized. Soft tissues are unremarkable. IMPRESSION: No fracture or acute finding. Electronically Signed   By: Lajean Manes M.D.   On: 02/25/2022 18:36      Armandina Gemma 02/26/2022  Patient ID: Stephanie Pearson, female   DOB: 12/05/1927, 89 y.o.   MRN: [id number]

## 2022-02-26 NOTE — ED Notes (Signed)
Patient's daughter, Olegario Shearer updated over the phone about patient's admission status.

## 2022-02-26 NOTE — TOC CAGE-AID Note (Signed)
Transition of Care [facility]) - CAGE-AID Screening  Patient Details  Name: Stephanie Pearson MRN: [id number] Date of Birth: February 22, 1927  Clinical Narrative:  Patient to ED after a GLF at home. Patient denies any alcohol or drug abuse. No need for substance abuse resources at this time.  CAGE-AID Screening:   Have You Ever Felt You Ought to Cut Down on Your Drinking or Drug Use?: No Have People Annoyed You By Critizing Your Drinking Or Drug Use?: No Have You Felt Bad Or Guilty About Your Drinking Or Drug Use?: No Have You Ever Had a Drink or Used Drugs First Thing In The Morning to Steady Your Nerves or to Get Rid of a Hangover?: No CAGE-AID Score: 0  Substance Abuse Education Offered: No

## 2022-02-26 NOTE — Consult Note (Signed)
ORTHOPAEDIC CONSULTATION  REQUESTING PHYSICIAN: Edwin Dada, *  Chief Complaint: left wrist pain after fall  HPI: Stephanie Pearson is a 89 y.o. female on Xarelto who complains of  left wrist pain after falling at home. She doesn't remember the exact circumstances and timing around the fall as she did hit her head. Reported left shoulder pain yesterday but didn't really complain of left wrist pain until today.  Imaging shows an acute transverse fracture through the distal radius. Acute nondisplaced fracture through the distal ulna. Nondisplaced ulnar styloid fracture.    Orthopedics was consulted for evaluation.    No history of MI, CVA, PE. + h/o DVT. Previously ambulatory with the use of a cane.  The patient is living at home alone.    Past Medical History:  Diagnosis Date   Cancer [facility])    History of blood clots    Hypertension    Hyponatremia 02/25/2022   Personal history of chemotherapy 1999   Past Surgical History:  Procedure Laterality Date   ABDOMINAL HYSTERECTOMY     BREAST EXCISIONAL BIOPSY Right 2002   BREAST EXCISIONAL BIOPSY Bilateral 05/08/1997   BREAST SURGERY     EYE SURGERY     MASTECTOMY Left 1999   ROTATOR CUFF REPAIR     spot removed from lung     Social History   Socioeconomic History   Marital status: Widowed    Spouse name: Not on file   Number of children: Not on file   Years of education: Not on file   Highest education level: Not on file  Occupational History   Not on file  Tobacco Use   Smoking status: Never   Smokeless tobacco: Never  Substance and Sexual Activity   Alcohol use: No   Drug use: No   Sexual activity: Never  Other Topics Concern   Not on file  Social History Narrative   Not on file   Social Determinants of Health   Financial Resource Strain: Not on file  Food Insecurity: Not on file  Transportation Needs: Not on file  Physical Activity: Not on file  Stress: Not on file  Social Connections: Not  on file   Family History  Problem Relation Age of Onset   Breast cancer Sister    Allergies  Allergen Reactions   Prilosec [Omeprazole] Rash   Prior to Admission medications   Medication Sig Start Date End Date Taking? Authorizing Provider  amLODipine (NORVASC) 2.5 MG tablet  05/09/13   [provider]  fosinopril (MONOPRIL) 40 MG tablet  06/16/13   [provider]  lovastatin (MEVACOR) 40 MG tablet  05/12/13   [provider]  metoprolol (LOPRESSOR) 50 MG tablet  05/30/13   [provider]  pantoprazole (PROTONIX) 40 MG tablet  04/07/13   [provider]  Alveda Reasons 20 MG TABS tablet  05/20/13   [provider]   DG Wrist Complete Left  Result Date: 02/25/2022 CLINICAL DATA:  Fall EXAM: LEFT WRIST - COMPLETE 3+ VIEW COMPARISON:  None Available. FINDINGS: The bones are osteopenic. There is an acute transverse fracture through the distal radius, mildly impacted. There is 7 mm of posterior offset along the posterior aspect of the fracture. There is an acute nondisplaced fracture through the distal ulna which is nondisplaced. Nondisplaced ulnar styloid fracture is present. There are degenerative changes of the wrist with radiocarpal and ulnocarpal joint space narrowing and calcification of the triangular fibrocartilage. There is soft tissue swelling  surrounding the wrist. IMPRESSION: 1. Acute transverse fracture through the distal radius. 2. Acute nondisplaced fracture through the distal ulna. 3. Nondisplaced ulnar styloid fracture. Electronically Signed   By: Ronney Asters M.D.   On: 02/25/2022 23:14   CT MAXILLOFACIAL WO CONTRAST  Result Date: 02/25/2022 CLINICAL DATA:  Initial evaluation for acute trauma, fall. EXAM: CT MAXILLOFACIAL WITHOUT CONTRAST TECHNIQUE: Multidetector CT imaging of the maxillofacial structures was performed. Multiplanar CT image reconstructions were also generated. RADIATION DOSE REDUCTION: This exam was performed according  to the departmental dose-optimization program which includes automated exposure control, adjustment of the mA and/or kV according to patient size and/or use of iterative reconstruction technique. COMPARISON:  None Available. FINDINGS: Osseous: Subtle lucency extends through the left cycle medic arch (series 6, image 13), somewhat age indeterminate, but presumably acute. There are acute minimally displaced fractures extending through the posterior wall of the left maxillary sinus. Additional acute nondisplaced fracture extends through the anterior wall. Associated fractures of the left orbital floor with up to 2 mm of depression (series 9, image 31). Comminuted and mildly angulated fractures of the lateral left orbital wall noted as well. Left lamina per appreciate intact. No acute osseous abnormality about the contralateral right orbit. Nasal bones intact. Right-to-left nasal septal deviation without fracture. Mandible intact. Mandibular condyles normally situated. No acute abnormality about the dentition. Orbits: Fractures involving the left orbital floor and lateral left orbital wall as above. Small amount of hemorrhage present within the extraconal fat of the inferior left orbit. Globes intact. Prior ocular lens replacement. Sinuses: Hemosinus within the left maxillary sinus. Scattered mucosal thickening present within the sphenoid ethmoidal sinuses. Moderate left mastoid effusion, partially visualized. Soft tissues: Acute left periorbital and facial contusion. No frank hematoma. Limited intracranial: Small focus of acute hemorrhage noted at the anterior/inferior left frontal lobe (series 4, image 71). Finding better characterized on corresponding head CT. IMPRESSION: 1. Acute left facial tripod fracture as detailed above. 2. Small volume hemorrhage within the inferior bony left orbit. No retro-orbital hematoma. Globes intact. 3. Overlying acute left periorbital and facial contusion. 4. Small focus of acute  hemorrhage at the anterior/inferior left frontal lobe, better characterized on corresponding head CT. Electronically Signed   By: Jeannine Boga M.D.   On: 02/25/2022 19:52   CT CHEST ABDOMEN PELVIS W CONTRAST  Result Date: 02/25/2022 CLINICAL DATA:  Polytrauma, blunt [id number] Trauma [id number] EXAM: CT CHEST, ABDOMEN, AND PELVIS WITH CONTRAST TECHNIQUE: Multidetector CT imaging of the chest, abdomen and pelvis was performed following the standard protocol during bolus administration of intravenous contrast. RADIATION DOSE REDUCTION: This exam was performed according to the departmental dose-optimization program which includes automated exposure control, adjustment of the mA and/or kV according to patient size and/or use of iterative reconstruction technique. CONTRAST:  62m OMNIPAQUE IOHEXOL 350 MG/ML SOLN COMPARISON:  CT abdomen pelvis 02/16/2004 FINDINGS: CHEST: Cardiovascular: No aortic injury. The thoracic aorta is normal in caliber. The heart is normal in size. No significant pericardial effusion. Likely mitral annular calcifications. Aortic valve leaflet calcifications. Moderate atherosclerotic plaque of the aorta. Left anterior descending coronary calcification. The main pulmonary artery is normal in caliber. No central pulmonary embolus. Mediastinum/Nodes: No pneumomediastinum. No mediastinal hematoma. The esophagus is unremarkable.  Small hiatal hernia. Subcentimeter thyroid hypodense nodules. Not clinically significant; no follow-up imaging recommended (ref: J Am Coll Radiol. 2015 Feb;12(2): 143-50). The central airways are patent. No mediastinal, hilar, or axillary lymphadenopathy. Lungs/Pleura: No focal consolidation. No pulmonary nodule. No pulmonary mass. No pulmonary contusion or  laceration. No pneumatocele formation. No pleural effusion. No pneumothorax. No hemothorax. Musculoskeletal/Chest wall: No chest wall mass.  Left mastectomy. Diffusely decreased bone density. No acute rib or sternal  fracture. Old healed bilateral rib fractures. No spinal fracture. Rotator cuff anchor suture along the left humeral head. Severe degenerative changes of the bilateral shoulders. ABDOMEN / PELVIS: Hepatobiliary: Not enlarged. No focal lesion. No laceration or subcapsular hematoma. Status post cholecystectomy.  Pneumobilia. Pancreas: Normal pancreatic contour. No main pancreatic duct dilatation. Spleen: Not enlarged. No focal lesion. No laceration, subcapsular hematoma, or vascular injury. Adrenals/Urinary Tract: No nodularity bilaterally. Bilateral kidneys enhance symmetrically. No hydronephrosis. No contusion, laceration, or subcapsular hematoma. Subcentimeter hypodensities too small to characterize-no further follow-up indicated. No injury to the vascular structures or collecting systems. No hydroureter. The urinary bladder is unremarkable. Stomach/Bowel: No small or large bowel wall thickening or dilatation. Diffuse sigmoid diverticulosis. Otherwise scattered colonic diverticulosis. The appendix is not definitely identified with no inflammatory changes in the right lower quadrant to suggest acute appendicitis. Vasculature/Lymphatics: No abdominal aorta or iliac aneurysm. No active contrast extravasation or pseudoaneurysm. No abdominal, pelvic, inguinal lymphadenopathy. Reproductive: Normal. Other: No simple free fluid ascites. No pneumoperitoneum. No hemoperitoneum. No mesenteric hematoma identified. No organized fluid collection. Musculoskeletal: No significant soft tissue hematoma. Bilateral small fat containing inguinal hernias. Diffusely decreased bone density. No acute pelvic fracture. Old healed right hip fracture. No spinal fracture. Grade 2 anterolisthesis of L5 on S1. Grade 1 anterolisthesis of L4 on L5. Ports and Devices: None. IMPRESSION: 1. No acute traumatic injury to the chest, abdomen, or pelvis. 2. No acute fracture or traumatic malalignment of the thoracic or lumbar spine. 3. Other imaging  findings of potential clinical significance: Colonic diverticulosis with no acute diverticulitis. Bilateral fat containing inguinal hernias. Aortic Atherosclerosis (ICD10-I70.0) including likely mitral annular, coronary artery, as well as aortic valve leaflet calcifications-correlate for aortic stenosis. Electronically Signed   By: Iven Finn M.D.   On: 02/25/2022 19:51   CT CERVICAL SPINE WO CONTRAST  Result Date: 02/25/2022 CLINICAL DATA:  Unwitnessed fall EXAM: CT CERVICAL SPINE WITHOUT CONTRAST TECHNIQUE: Multidetector CT imaging of the cervical spine was performed without intravenous contrast. Multiplanar CT image reconstructions were also generated. RADIATION DOSE REDUCTION: This exam was performed according to the departmental dose-optimization program which includes automated exposure control, adjustment of the mA and/or kV according to patient size and/or use of iterative reconstruction technique. COMPARISON:  None Available. FINDINGS: Alignment: Normal. Skull base and vertebrae: Craniocervical alignment is normal. The atlantodental interval is not widened. No acute fracture of the cervical spine. No lytic or blastic bone lesion. Soft tissues and spinal canal: No prevertebral fluid or swelling. No visible canal hematoma. There is extensive atherosclerotic calcification within the carotid bifurcations bilaterally. Disc levels: There is intervertebral disc space narrowing and endplate remodeling throughout the cervical spine, most severe at C5-C7 in keeping with changes of mild to moderate degenerative disc disease. Prevertebral soft tissues are not thickened on sagittal reformats. Spinal canal is widely patent. Multilevel uncovertebral and facet arthrosis results in mild multilevel neuroforaminal narrowing, most severe at C5-C7. Upper chest: Negative. Other: None IMPRESSION: 1. No acute fracture or listhesis of the cervical spine. 2. Extensive atherosclerotic calcification within the carotid  bifurcations bilaterally. If indicated, the degree of stenosis would be better assessed with dedicated carotid artery Doppler sonography. Electronically Signed   By: Fidela Salisbury M.D.   On: 02/25/2022 19:42   CT HEAD WO CONTRAST  Result Date: 02/25/2022 CLINICAL DATA:  Trauma EXAM:  CT HEAD WITHOUT CONTRAST TECHNIQUE: Contiguous axial images were obtained from the base of the skull through the vertex without intravenous contrast. RADIATION DOSE REDUCTION: This exam was performed according to the departmental dose-optimization program which includes automated exposure control, adjustment of the mA and/or kV according to patient size and/or use of iterative reconstruction technique. COMPARISON:  None Available. FINDINGS: Brain: There is a very small amount of subarachnoid hemorrhage in the anterior left frontal lobe image 4/16 and 4/20 there is no mass effect, midline shift or extra-axial fluid collection. There is no acute infarct. There is mild diffuse atrophy. There is moderate periventricular and deep white matter hypodensity, likely chronic small vessel ischemic change. Vascular: Atherosclerotic calcifications are present within the cavernous internal carotid arteries. Skull: Negative for skull fracture. Fractures of the left maxilla and left orbit noted. Please see dedicated CT maxillofacial for further description. Sinuses/Orbits: There is hemorrhage/air-fluid level in the left maxillary sinus. Bilateral mastoid effusions are present. Other: Left periorbital swelling. IMPRESSION: 1. Very small amount of subarachnoid hemorrhage in the anterior left frontal lobe. No mass effect or midline shift. 2. Atrophy and chronic small vessel ischemic changes. 3. Fractures of the left maxilla and left orbit. Please see dedicated CT maxillofacial for further description. Electronically Signed   By: Ronney Asters M.D.   On: 02/25/2022 19:39   DG Humerus Left  Result Date: 02/25/2022 CLINICAL DATA:  Trauma to the left  upper extremity. EXAM: LEFT SHOULDER; LEFT HUMERUS - 2+ VIEW COMPARISON:  Chest radiograph dated 02/25/2022. FINDINGS: No acute fracture or dislocation of the left shoulder. The bones are osteopenic. There is degenerative changes with calcific tendinopathy of the supraspinatus muscle. Humeral head anchor pins noted. Multiple old appearing left rib fractures. Several surgical clips noted over the left axilla. IMPRESSION: 1. No acute fracture or dislocation of the left shoulder or left humerus. 2. Osteopenia and degenerative changes. Electronically Signed   By: Anner Crete M.D.   On: 02/25/2022 18:52   DG Shoulder Left Port  Result Date: 02/25/2022 CLINICAL DATA:  Trauma to the left upper extremity. EXAM: LEFT SHOULDER; LEFT HUMERUS - 2+ VIEW COMPARISON:  Chest radiograph dated 02/25/2022. FINDINGS: No acute fracture or dislocation of the left shoulder. The bones are osteopenic. There is degenerative changes with calcific tendinopathy of the supraspinatus muscle. Humeral head anchor pins noted. Multiple old appearing left rib fractures. Several surgical clips noted over the left axilla. IMPRESSION: 1. No acute fracture or dislocation of the left shoulder or left humerus. 2. Osteopenia and degenerative changes. Electronically Signed   By: Anner Crete M.D.   On: 02/25/2022 18:52   DG Chest Port 1 View  Result Date: 02/25/2022 CLINICAL DATA:  Trauma EXAM: PORTABLE CHEST 1 VIEW COMPARISON:  12/20/2021, 12/13/2021 FINDINGS: Heart size is stable. No focal airspace consolidation, pleural effusion, or pneumothorax. Multiple chronic bilateral rib fractures. No definite acute rib fracture is seen. IMPRESSION: 1. No acute findings within the chest. 2. Multiple chronic bilateral rib fractures. No definite acute rib fracture is seen. Electronically Signed   By: Davina Poke D.O.   On: 02/25/2022 18:36   DG Pelvis Portable  Result Date: 02/25/2022 CLINICAL DATA:  Trauma. EXAM: PORTABLE PELVIS 1-2 VIEWS  COMPARISON:  None Available. FINDINGS: No fracture.  No bone lesion. Hip joints, SI joints and pubic symphysis are normally aligned. Skeletal structures are demineralized. Soft tissues are unremarkable. IMPRESSION: No fracture or acute finding. Electronically Signed   By: Lajean Manes M.D.   On: 02/25/2022 18:36  Positive ROS: All other systems have been reviewed and were otherwise negative with the exception of those mentioned in the HPI and as above.  Objective: Labs cbc Recent Labs    02/25/22 1817 02/25/22 1832 02/26/22 0141  WBC 15.4*  --  10.2  HGB 12.9 14.3 10.6*  HCT 40.2 42.0 31.6*  PLT 193  --  179    Labs inflam No results for input(s): "CRP" in the last 72 hours.  Invalid input(s): "ESR"  Labs coag Recent Labs    02/25/22 1817  INR 1.4*    Recent Labs    02/25/22 1817 02/25/22 1832 02/26/22 0141  NA 128* 129* 131*  K 4.2 4.1 3.4*  CL 93* 94* 99  CO2 24  --  21*  GLUCOSE 126* 124* 111*  BUN 12 14 10  $ CREATININE 0.87 0.70 0.84  CALCIUM 9.0  --  8.4*    Physical Exam: Vitals:   02/26/22 0347 02/26/22 0758  BP: (!) 150/68 (!) 125/55  Pulse: 61 62  Resp: 16 17  Temp: 97.9 F (36.6 C) 97.8 F (36.6 C)  SpO2: 98% 100%   General: Alert, no acute distress.  Laying in bed, calm, comfortable, ecchymosis and edema to left eye Mental status: Alert and Oriented x3 Neurologic: Speech Clear and organized, no gross focal findings or movement disorder appreciated. Respiratory: No cyanosis, no use of accessory musculature Cardiovascular: No pedal edema GI: Abdomen is soft and non-tender, non-distended. Skin: Warm and dry.   Extremities: Warm and well perfused w/o edema Psychiatric: Patient is competent for consent with normal mood and affect  MUSCULOSKELETAL:  LUE in splint, can move fingers, edema and ecchymosis present, NVI  Other extremities are atraumatic with painless ROM and NVI.  Assessment / Plan: Principal Problem:   SAH (subarachnoid  hemorrhage) (HCC) Active Problems:   Essential hypertension   Hyponatremia   History of DVT (deep vein thrombosis)   Closed fracture of tripod, initial encounter (HCC)   Wrist fracture, closed, left, initial encounter    Will manage this non-operatively with immobilization on an outpatient basis. Patient to remain in splint until outpatient f/u in our office. At that time will remove splint and re-evaluate if ready for cast. Sling for comfort. Ok to discharge any time from ortho standpoint.    Weightbearing: NWB LUE Insicional and dressing care: Reinforce dressings as needed Orthopedic device(s): Splint Showering: keep splint dry VTE prophylaxis:  per primary   Pain control: minimize narcotics as able Follow - up plan: 1 week in the office Contact information:  Edmonia Lynch MD, [facility] PA-C     Britt Bottom PA-C Office [phone] 02/26/2022 9:14 AM

## 2022-02-26 NOTE — Plan of Care (Signed)
POC initiated and progressing. 

## 2022-02-26 NOTE — Progress Notes (Signed)
Patient arrived to the floor alert and oriented, CHG bath given, patient oriented to staff and equipment. Cardiac monitoring initiated, CCMD notified, we'll continue to monitor.

## 2022-02-26 NOTE — Evaluation (Signed)
Occupational Therapy Evaluation Patient Details Name: Stephanie Pearson MRN: [id number] DOB: [birth year] Today's Date: 02/26/2022   History of Present Illness Pt is a 89 y/o female presenting after ground level fall. Found with small subarachnoid hemorrhage of frontal lobe, L facial tripod fractures, L radius, ulnar, and styloid fx. PMH - lt breast CA, HTN, DVT   Clinical Impression   PTA patient independent with ADLs, mobility using cane and family assists with IADLs. Admitted for above and presents with problem list below.  Pt requires min assist for bed mobility, mod assist for transfers and min assist for functional mobility in room with hand held support; for ADLs she requires setup to max assist due to decreased functional use of L UE. Based on performance today, believe she will best benefit from continued OT services acutely and after dc at [facility] level to optimize independence, safety and return to PLOF.  Will follow acutely.      Recommendations for follow up therapy are one component of a multi-disciplinary discharge planning process, led by the attending physician.  Recommendations may be updated based on patient status, additional functional criteria and insurance authorization.   Follow Up Recommendations  Home health OT     Assistance Recommended at Discharge Frequent or constant Supervision/Assistance  Patient can return home with the following A little help with walking and/or transfers;A lot of help with bathing/dressing/bathroom;Assistance with cooking/housework;Assist for transportation;Help with stairs or ramp for entrance    Functional Status Assessment  Patient has had a recent decline in their functional status and demonstrates the ability to make significant improvements in function in a reasonable and predictable amount of time.  Equipment Recommendations  None recommended by OT    Recommendations for Other Services       Precautions / Restrictions  Precautions Precautions: Fall Required Braces or Orthoses: Sling;Splint/Cast Splint/Cast: Lt forearm/wrist splint Restrictions Weight Bearing Restrictions: Yes LUE Weight Bearing: Non weight bearing      Mobility Bed Mobility Overal bed mobility: Needs Assistance Bed Mobility: Supine to Sit     Supine to sit: Min assist, HOB elevated     General bed mobility comments: Assist to pull up on therapist's hand to elevate trunk into sitting    Transfers Overall transfer level: Needs assistance Equipment used: 1 person hand held assist Transfers: Sit to/from Stand Sit to Stand: Mod assist           General transfer comment: Assist to bring hips up and to stabilize      Balance Overall balance assessment: Needs assistance Sitting-balance support: No upper extremity supported, Feet supported Sitting balance-Leahy Scale: Fair     Standing balance support: Single extremity supported, During functional activity Standing balance-Leahy Scale: Poor Standing balance comment: UE support and min assist for static standing                           ADL either performed or assessed with clinical judgement   ADL Overall ADL's : Needs assistance/impaired Eating/Feeding: Set up;Sitting   Grooming: Set up;Sitting           Upper Body Dressing : Minimal assistance;Sitting   Lower Body Dressing: Maximal assistance;Sit to/from stand Lower Body Dressing Details (indicate cue type and reason): assist for socks, unable to use L UE Toilet Transfer: Moderate assistance Toilet Transfer Details (indicate cue type and reason): simulated         Functional mobility during ADLs: Minimal assistance;Moderate assistance;+2 for safety/equipment General  ADL Comments: pt limited by decreased functional use of L UE     Vision Baseline Vision/History: 1 Wears glasses Ability to See in Adequate Light: 1 Impaired Patient Visual Report: No change from baseline Additional  Comments: L eye edema, difficult opening but pt reports vision [facility]     Perception     Praxis      Pertinent Vitals/Pain Pain Assessment Pain Assessment: No/denies pain     Hand Dominance Right   Extremity/Trunk Assessment Upper Extremity Assessment Upper Extremity Assessment: Generalized weakness;LUE deficits/detail LUE Deficits / Details: L UE splinted from elbow to hand, edema in hand but sensation WFL.  Albe to raise arm from shoulder LUE Sensation: WNL LUE Coordination: decreased fine motor;decreased gross motor   Lower Extremity Assessment Lower Extremity Assessment: Defer to PT evaluation       Communication Communication Communication: No difficulties   Cognition Arousal/Alertness: Awake/alert Behavior During Therapy: WFL for tasks assessed/performed Overall Cognitive Status: History of cognitive impairments - at baseline                                 General Comments: Mild memory impairment per daughter, appears [facility]     General Comments  VSS on RA    Exercises     Shoulder Instructions      Home Living Family/patient expects to be discharged to:: Private residence Living Arrangements: Children (daughter and son in law) Available Help at Discharge: Family;Available PRN/intermittently Type of Home: House Home Access: Stairs to enter Entrance Stairs-Number of Steps: 2   Home Layout: Two level;Full bath on main level;Able to live on main level with bedroom/bathroom     Bathroom Shower/Tub: Walk-in shower   Bathroom Toilet: Handicapped height     Home Equipment: Conservation officer, nature (2 wheels);Grab bars - toilet;Grab bars - tub/shower;Shower seat;Shower seat - built in;Cane - single point   Additional Comments: always has support at night      Prior Functioning/Environment Prior Level of Function : Needs assist             Mobility Comments: using cane for mobility ADLs Comments: no driving, independent ADLs, family assist with  IADLs but pt completes meds using pill box        OT Problem List: Decreased strength;Decreased activity tolerance;Impaired balance (sitting and/or standing);Decreased coordination;Decreased knowledge of use of DME or AE;Decreased knowledge of precautions;Impaired UE functional use      OT Treatment/Interventions: Self-care/ADL training;Therapeutic exercise;DME and/or AE instruction;Therapeutic activities;Patient/family education;Balance training    OT Goals(Current goals can be found in the care plan section) Acute Rehab OT Goals Patient Stated Goal: home OT Goal Formulation: With patient Time For Goal Achievement: 03/12/22 Potential to Achieve Goals: Good  OT Frequency: Min 2X/week    Co-evaluation PT/OT/SLP Co-Evaluation/Treatment: Yes Reason for Co-Treatment: For patient/therapist safety PT goals addressed during session: Mobility/safety with mobility OT goals addressed during session: ADL's and self-care      AM-PAC OT "6 Clicks" Daily Activity     Outcome Measure Help from another person eating meals?: A Little Help from another person taking care of personal grooming?: A Little Help from another person toileting, which includes using toliet, bedpan, or urinal?: A Little Help from another person bathing (including washing, rinsing, drying)?: A Lot Help from another person to put on and taking off regular upper body clothing?: A Little Help from another person to put on and taking off regular lower body clothing?: A  Lot 6 Click Score: 16   End of Session Nurse Communication: Mobility status  Activity Tolerance: Patient tolerated treatment well Patient left: in chair;with call bell/phone within reach;with chair alarm set;with family/visitor present  OT Visit Diagnosis: Other abnormalities of gait and mobility (R26.89);Muscle weakness (generalized) (M62.81);History of falling (Z91.81)                Time: [id number] OT Time Calculation (min): 22 min Charges:  OT General  Charges $OT Visit: 1 Visit OT Evaluation $OT Eval Moderate Complexity: [place], OT Acute Rehabilitation Services Office [phone]   Delight Stare 02/26/2022, 10:28 AM

## 2022-02-27 ENCOUNTER — Observation Stay (HOSPITAL_COMMUNITY): Payer: PPO

## 2022-02-27 DIAGNOSIS — I609 Nontraumatic subarachnoid hemorrhage, unspecified: Secondary | ICD-10-CM | POA: Diagnosis not present

## 2022-02-27 DIAGNOSIS — Z515 Encounter for palliative care: Secondary | ICD-10-CM

## 2022-02-27 DIAGNOSIS — Z7189 Other specified counseling: Secondary | ICD-10-CM

## 2022-02-27 DIAGNOSIS — R4182 Altered mental status, unspecified: Secondary | ICD-10-CM | POA: Diagnosis not present

## 2022-02-27 DIAGNOSIS — I951 Orthostatic hypotension: Secondary | ICD-10-CM

## 2022-02-27 MED ORDER — SODIUM CHLORIDE 0.9 % IV BOLUS
500.0000 mL | Freq: Once | INTRAVENOUS | Status: AC
  Administered 2022-02-27: 500 mL via INTRAVENOUS

## 2022-02-27 MED ORDER — SODIUM CHLORIDE 0.9 % IV BOLUS: 500.0000 mL | Freq: Once | INTRAVENOUS | Status: DC | PRN

## 2022-02-27 MED ORDER — MELATONIN 3 MG PO TABS
3.0000 mg | ORAL_TABLET | Freq: Every day | ORAL | Status: DC
  Administered 2022-02-27 – 2022-02-28 (×2): 3 mg via ORAL
  Filled 2022-02-27 (×2): qty 1

## 2022-02-27 MED ORDER — METOPROLOL TARTRATE 25 MG PO TABS
25.0000 mg | ORAL_TABLET | Freq: Two times a day (BID) | ORAL | Status: DC
  Administered 2022-02-27: 25 mg via ORAL
  Filled 2022-02-27: qty 1

## 2022-02-27 NOTE — Progress Notes (Signed)
PROGRESS NOTE  Stephanie Pearson [id number] DOB: 10-16-1927 DOA: 02/25/2022 PCP: Merry Lofty, NP-C  Brief History   Mrs. Stephanie Pearson is a 89 y.o. F lives alone, hx HTN, DVT on Xarelto who fell in her kitchen.  In the ER, found to have small SAH, fracture of facial tripod and wrist fracture.  She has been placed in a sugar-tong and a sling. The plan is for to discharge to home with home health. She is not to blow her nose, and she is to follow up with ophthalmology as outpatient. She has plans to follow up with Dr. Lucianne Lei. Xarelto has been held and will be held at discharge.   She was to have discharged to home today, but when she was gotten up to walk in the halls, she became dizzy. She was found to be hypotensive with a systolic of 83 and a heart rate in the 30's and 40's. A rapid response was called. She was given a 500 cc bolus with improvement in her SP. Heart rate is now 109.  Will decrease metoprolol to 25 mg and gie cautious continuous fluids. Will try to walk in halls again tomorrow.  A & P  SAH (subarachnoid hemorrhage) [facility]) Neurosurgery consulted.  They recommend repeat CTH if neurological changes, otherwise no specific follow up needed.  So far, she is neurologically intact, no changes. - Hold home Xarelto - Defer timing of restart to PCP after discharge -- Will CT head again due to episode of bradycardia and dizzyness with walking today.    Closed fracture of tripod, initial encounter ([place]) - No nose blowing - Analgesics as needed - Follow up with Ophthalmology after discharge, Dr. Lucianne Lei info in discharge instructions  Essential hypertension - Continue amlodipine, lisinopril, metoprolol  Hyponatremia Mild, asymptomatic.  No work up needed  Fall at home, initial encounter Patient able to recount to me in detail her fall, was mechanical in nature, no LOC/syncope/near syncope.  Wrist fracture, closed, left, initial encounter - Maintain in sugartong and sling - Follow  up with Dr. Percell Miller in 1 week  History of DVT (deep vein thrombosis) - Hold Xarelto at discharge  Orthostatic hypotension The patient became acutely dizzy today with walking. Her heart rate also dropped to 30's and 40's. She was given a 500 cc bolus with improvement of her blood pressure to the 100's and her heart rate to the 100's. I have reduced her dose of metoprolol. Will monitor her overnight and try to walk her again tomorrow.  DVT prophylaxis: AC held due to [facility] Code Status: Full Code Family Communication: Daughter was at bedside Disposition Plan: Home with home health    Stephanie Chaidez, DO [facility] Direct contact: see [email or web address]  7PM-7AM contact night coverage as above 02/27/2022, 1:09 PM  LOS: 0 days   Consultants  Neurosurgery Orthopedic surgery  Procedures  None  Antibiotics  None  Subjective  The patient is resting comfortably. No new complaints.  Objective   Vitals:  Vitals:   02/27/22 1232 02/27/22 1300  BP: (!) 108/49 (!) 113/52  Pulse: (!) 58 (!) 59  Resp: 16 18  Temp: 97.6 F (36.4 C)   SpO2: 100% 100%    Exam:  Constitutional:  Appears calm and comfortable Eyes:  pupils and irises appear normal Normal lids and conjunctivae Respiratory:  CTA bilaterally, no w/r/r.  Respiratory effort normal. No retractions or accessory muscle use Cardiovascular:  RRR, no m/r/g No LE extremity edema   Normal pedal pulses Abdomen:  Abdomen  appears normal; no tenderness or masses No hernias No HSM Musculoskeletal:  Digits/nails BUE: no clubbing, cyanosis, petechiae, infection exam of joints, bones, muscles of at least one of following: head/neck, RUE, LUE, RLE, LLE   strength and tone normal, no atrophy, no abnormal movements No tenderness, masses Normal ROM, no contractures  gait and station Skin:  No rashes, lesions, ulcers palpation of skin: no induration or nodules Neurologic:  CN 2-12 intact Sensation all 4 extremities  intact Psychiatric:  Mental status Mood, affect appropriate Orientation to person, place, time    I have personally reviewed the following:   Today's Data  CBC    Component Value Date/Time   WBC 10.2 02/26/2022 0141   RBC 3.70 (L) 02/26/2022 0141   HGB 10.6 (L) 02/26/2022 0141   HCT 31.6 (L) 02/26/2022 0141   PLT 179 02/26/2022 0141   MCV 85.4 02/26/2022 0141   MCH 28.6 02/26/2022 0141   MCHC 33.5 02/26/2022 0141   RDW 14.9 02/26/2022 0141      Latest Ref Rng & Units 02/26/2022    1:41 AM 02/25/2022    6:32 PM 02/25/2022    6:17 PM  BMP  Glucose 70 - 99 mg/dL 111  124  126   BUN 8 - 23 mg/dL 10  14  12   $ Creatinine 0.44 - 1.00 mg/dL 0.84  0.70  0.87   Sodium 135 - 145 mmol/L 131  129  128   Potassium 3.5 - 5.1 mmol/L 3.4  4.1  4.2   Chloride 98 - 111 mmol/L 99  94  93   CO2 22 - 32 mmol/L 21   24   Calcium 8.9 - 10.3 mg/dL 8.4   9.0      Imaging  CT head ordered due to acute hypotension and bradycardia with ambulation.  Scheduled Meds:  amLODipine  2.5 mg Oral Daily   lisinopril  40 mg Oral Daily   metoprolol tartrate  25 mg Oral BID   pantoprazole  40 mg Oral Daily   pravastatin  40 mg Oral q1800   sodium chloride flush  3 mL Intravenous Q12H   Continuous Infusions:  sodium chloride      Principal Problem:   SAH (subarachnoid hemorrhage) (HCC) Active Problems:   Closed fracture of tripod, initial encounter ([place])   Essential hypertension   Hyponatremia   History of DVT (deep vein thrombosis)   Wrist fracture, closed, left, initial encounter   Fall at home, initial encounter   LOS: 0 days

## 2022-02-27 NOTE — Progress Notes (Signed)
1140 Pt in chair and requesting to get back in bed. Attempted to get pt back in bed. C/o dizziness and fell back into chair. Vitals taken. BP: 83/51. HR ranging in the 30-40s. Charge nurse notified. Rapid response called. Pt transferred back to bed and placed in Trendelenburg.   1201 NS bolus initiated per rapid response nurse verbal order. Pt states she "feels better," but still appears lethargic. Notified trauma team. Paged hospitalist.

## 2022-02-27 NOTE — Progress Notes (Signed)
Occupational Therapy Treatment Patient Details Name: Stephanie Pearson MRN: [id number] DOB: 02/23/1927 Today's Date: 02/27/2022   History of present illness Pt is a 89 y/o female presenting after ground level fall. Found with small subarachnoid hemorrhage of frontal lobe, L facial tripod fractures, L radius, ulnar, and styloid fx. PMH - lt breast CA, HTN, DVT   OT comments  Pt seen this AM in conjunction with family. Focused on mobility during ADLs using quad cane at min guard. Collaborated w/ pt and family re: compensatory strategies for ADLs, fall prevention, sling mgmt, edema prevention and appropriate cane for optimal stability. Daughter w/ good insight into strategies with HHOT remaining an appropriate recommendation.   Recommendations for follow up therapy are one component of a multi-disciplinary discharge planning process, led by the attending physician.  Recommendations may be updated based on patient status, additional functional criteria and insurance authorization.    Follow Up Recommendations  Home health OT     Assistance Recommended at Discharge Frequent or constant Supervision/Assistance  Patient can return home with the following  A little help with walking and/or transfers;A lot of help with bathing/dressing/bathroom;Assistance with cooking/housework;Assist for transportation;Help with stairs or ramp for entrance   Equipment Recommendations  Other (comment) (quad cane; family searching online for one)    Recommendations for Other Services      Precautions / Restrictions Precautions Precautions: Fall Required Braces or Orthoses: Sling;Splint/Cast Splint/Cast: Lt forearm/wrist splint Restrictions Weight Bearing Restrictions: Yes LUE Weight Bearing: Non weight bearing       Mobility Bed Mobility               General bed mobility comments: received after ambulating with PTA    Transfers Overall transfer level: Needs assistance Equipment used: Quad  cane Transfers: Sit to/from Stand Sit to Stand: Min assist           General transfer comment: Min A to stand from recliner with assist to fully lift/tuck in bottom - typically uses lift chair     Balance Overall balance assessment: Needs assistance Sitting-balance support: No upper extremity supported, Feet supported Sitting balance-Leahy Scale: Fair     Standing balance support: Single extremity supported, During functional activity Standing balance-Leahy Scale: Fair Standing balance comment: can statically stand at sink                           ADL either performed or assessed with clinical judgement   ADL Overall ADL's : Needs assistance/impaired     Grooming: Minimal assistance;Standing;Oral care;Wash/dry face Grooming Details (indicate cue type and reason): standing at sink, assist to put toothpaste on toothbrush - educated pt/daughter on alternative strategies for this task                             Functional mobility during ADLs: Min guard;Cane General ADL Comments: Collab with pt/daughter on strategies for ADLs including: donning affected UE first, using clothes one size bigger (had already purchased pjs), getting dressed seated, assist for bathroom mobility, techniques for showering and covering affected UE, as well as cane stability levels    Extremity/Trunk Assessment Upper Extremity Assessment Upper Extremity Assessment: LUE deficits/detail LUE Deficits / Details: L UE splinted from elbow to hand, edema in hand but sensation WFL.  Albe to raise arm from shoulder LUE Coordination: decreased fine motor;decreased gross motor   Lower Extremity Assessment Lower Extremity Assessment: Defer to PT evaluation  Vision   Vision Assessment?: No apparent visual deficits   Perception     Praxis      Cognition Arousal/Alertness: Awake/alert Behavior During Therapy: WFL for tasks assessed/performed Overall Cognitive Status: History  of cognitive impairments - at baseline                                 General Comments: Mild memory impairment per daughter, appears WFL, pt able to recall NWB status of LUE        Exercises      Shoulder Instructions       General Comments daughter present, encouraged elevation of LUE when up in chair to reduce risk of edema    Pertinent Vitals/ Pain       Pain Assessment Pain Assessment: No/denies pain  Home Living                                          Prior Functioning/Environment              Frequency  Min 2X/week        Progress Toward Goals  OT Goals(current goals can now be found in the care plan section)  Progress towards OT goals: Progressing toward goals  Acute Rehab OT Goals Patient Stated Goal: home soon OT Goal Formulation: With patient Time For Goal Achievement: 03/12/22 Potential to Achieve Goals: Good ADL Goals Pt Will Perform Grooming: with set-up;with supervision;standing Pt Will Perform Upper Body Dressing: with supervision;with set-up;sitting Pt Will Perform Lower Body Dressing: with set-up;with supervision;sit to/from stand Pt Will Transfer to Toilet: with supervision;ambulating Pt Will Perform Toileting - Clothing Manipulation and hygiene: with supervision;with set-up;sit to/from stand  Plan Discharge plan remains appropriate    Co-evaluation                 AM-PAC OT "6 Clicks" Daily Activity     Outcome Measure   Help from another person eating meals?: A Little Help from another person taking care of personal grooming?: A Little Help from another person toileting, which includes using toliet, bedpan, or urinal?: A Little Help from another person bathing (including washing, rinsing, drying)?: A Lot Help from another person to put on and taking off regular upper body clothing?: A Little Help from another person to put on and taking off regular lower body clothing?: A Lot 6 Click Score:  16    End of Session Equipment Utilized During Treatment: Gait belt  OT Visit Diagnosis: Other abnormalities of gait and mobility (R26.89);Muscle weakness (generalized) (M62.81);History of falling (Z91.81)   Activity Tolerance Patient tolerated treatment well   Patient Left in chair;with call bell/phone within reach;with chair alarm set;with family/visitor present   Nurse Communication Mobility status        Time: [id number] OT Time Calculation (min): 22 min  Charges: OT General Charges $OT Visit: 1 Visit OT Treatments $Self Care/Home Management : 8-22 mins  Malachy Chamber, OTR/L Acute Rehab Services Office: [phone]   Layla Maw 02/27/2022, 12:13 PM

## 2022-02-27 NOTE — Significant Event (Signed)
Rapid Response Event Note   Reason for Call :  Syncopy, dizziness, hypotension, HR 30s  Initial Focused Assessment:  Per RN patient has been up to the chair for about an hour.  As RN was assisting patient back to bed she became dizzy and fell back into chair.  BP 83/51  HR 30-40s  Upon my arrival she is lying in the bed.  She is alert but weak/tired answering questions.   Lung sounds are clear, heart tones regular  BP 101/48  HR 51  RR 19  O2 sat 100% on RA      Interventions:  500cc NS bolus  Plan of Care:  Recheck VS post fluid bolus and again about an hour later   Event Summary:   MD Notified: [place] paged by RN Call Time: [place] Time: 1150 End Time: [place]  Raliegh Ip, RN

## 2022-02-27 NOTE — Progress Notes (Signed)
[facility] Progress Note     Subjective: CC-  Comfortable this morning. Denies any pain. Only taking tylenol. Tolerating diet. BM yesterday.  Objective: Vital signs in last 24 hours: Temp:  [97.3 F (36.3 C)-98 F (36.7 C)] 98 F (36.7 C) (02/12 0753) Pulse Rate:  [54-64] 60 (02/12 0753) Resp:  [14-20] 14 (02/12 0753) BP: (97-120)/(49-65) 114/56 (02/12 0755) SpO2:  [95 %-100 %] 99 % (02/12 0753) Last BM Date : 02/26/22  Intake/Output from previous day: 02/11 0701 - 02/12 0700 In: 720 [P.O.:720] Out: 402 [Urine:401; Stool:1] Intake/Output this shift: Total I/O In: -  Out: 800 [Urine:800]  PE: Gen:  Alert, NAD, pleasant HEENT: left periorbital ecchymosis present, <1cm lac left eyebrow, sutured with prolene Card:  RRR Pulm:  CTAB, no W/R/R, rate and effort normal on room air Abd: Soft, NT/ND, +BS Ext:  splint and sling to LUE, fingers WWP, able to wiggle fingers Psych: A&Ox4   Lab Results:  Recent Labs    02/25/22 1817 02/25/22 1832 02/26/22 0141  WBC 15.4*  --  10.2  HGB 12.9 14.3 10.6*  HCT 40.2 42.0 31.6*  PLT 193  --  179   BMET Recent Labs    02/25/22 1817 02/25/22 1832 02/26/22 0141  NA 128* 129* 131*  K 4.2 4.1 3.4*  CL 93* 94* 99  CO2 24  --  21*  GLUCOSE 126* 124* 111*  BUN 12 14 10  $ CREATININE 0.87 0.70 0.84  CALCIUM 9.0  --  8.4*   PT/INR Recent Labs    02/25/22 1817  LABPROT 17.2*  INR 1.4*   CMP     Component Value Date/Time   NA 131 (L) 02/26/2022 0141   K 3.4 (L) 02/26/2022 0141   CL 99 02/26/2022 0141   CO2 21 (L) 02/26/2022 0141   GLUCOSE 111 (H) 02/26/2022 0141   BUN 10 02/26/2022 0141   CREATININE 0.84 02/26/2022 0141   CALCIUM 8.4 (L) 02/26/2022 0141   PROT 6.9 02/25/2022 1817   ALBUMIN 3.5 02/25/2022 1817   AST 39 02/25/2022 1817   ALT 16 02/25/2022 1817   ALKPHOS 72 02/25/2022 1817   BILITOT 0.9 02/25/2022 1817   GFRNONAA >60 02/26/2022 0141   GFRAA  03/09/2009 2025    >60        The eGFR has  been calculated using the MDRD equation. This calculation has not been validated in all clinical situations. eGFR's persistently <60 mL/min signify possible Chronic Kidney Disease.   Lipase  No results found for: "LIPASE"     Studies/Results: DG Wrist Complete Left  Result Date: 02/25/2022 CLINICAL DATA:  Fall EXAM: LEFT WRIST - COMPLETE 3+ VIEW COMPARISON:  None Available. FINDINGS: The bones are osteopenic. There is an acute transverse fracture through the distal radius, mildly impacted. There is 7 mm of posterior offset along the posterior aspect of the fracture. There is an acute nondisplaced fracture through the distal ulna which is nondisplaced. Nondisplaced ulnar styloid fracture is present. There are degenerative changes of the wrist with radiocarpal and ulnocarpal joint space narrowing and calcification of the triangular fibrocartilage. There is soft tissue swelling surrounding the wrist. IMPRESSION: 1. Acute transverse fracture through the distal radius. 2. Acute nondisplaced fracture through the distal ulna. 3. Nondisplaced ulnar styloid fracture. Electronically Signed   By: Ronney Asters M.D.   On: 02/25/2022 23:14   CT MAXILLOFACIAL WO CONTRAST  Result Date: 02/25/2022 CLINICAL DATA:  Initial evaluation for acute trauma, fall. EXAM: CT MAXILLOFACIAL WITHOUT  CONTRAST TECHNIQUE: Multidetector CT imaging of the maxillofacial structures was performed. Multiplanar CT image reconstructions were also generated. RADIATION DOSE REDUCTION: This exam was performed according to the departmental dose-optimization program which includes automated exposure control, adjustment of the mA and/or kV according to patient size and/or use of iterative reconstruction technique. COMPARISON:  None Available. FINDINGS: Osseous: Subtle lucency extends through the left cycle medic arch (series 6, image 2), somewhat age indeterminate, but presumably acute. There are acute minimally displaced fractures  extending through the posterior wall of the left maxillary sinus. Additional acute nondisplaced fracture extends through the anterior wall. Associated fractures of the left orbital floor with up to 2 mm of depression (series 9, image 31). Comminuted and mildly angulated fractures of the lateral left orbital wall noted as well. Left lamina per appreciate intact. No acute osseous abnormality about the contralateral right orbit. Nasal bones intact. Right-to-left nasal septal deviation without fracture. Mandible intact. Mandibular condyles normally situated. No acute abnormality about the dentition. Orbits: Fractures involving the left orbital floor and lateral left orbital wall as above. Small amount of hemorrhage present within the extraconal fat of the inferior left orbit. Globes intact. Prior ocular lens replacement. Sinuses: Hemosinus within the left maxillary sinus. Scattered mucosal thickening present within the sphenoid ethmoidal sinuses. Moderate left mastoid effusion, partially visualized. Soft tissues: Acute left periorbital and facial contusion. No frank hematoma. Limited intracranial: Small focus of acute hemorrhage noted at the anterior/inferior left frontal lobe (series 4, image 71). Finding better characterized on corresponding head CT. IMPRESSION: 1. Acute left facial tripod fracture as detailed above. 2. Small volume hemorrhage within the inferior bony left orbit. No retro-orbital hematoma. Globes intact. 3. Overlying acute left periorbital and facial contusion. 4. Small focus of acute hemorrhage at the anterior/inferior left frontal lobe, better characterized on corresponding head CT. Electronically Signed   By: Jeannine Boga M.D.   On: 02/25/2022 19:52   CT CHEST ABDOMEN PELVIS W CONTRAST  Result Date: 02/25/2022 CLINICAL DATA:  Polytrauma, blunt [id number] Trauma [id number] EXAM: CT CHEST, ABDOMEN, AND PELVIS WITH CONTRAST TECHNIQUE: Multidetector CT imaging of the chest, abdomen and pelvis was  performed following the standard protocol during bolus administration of intravenous contrast. RADIATION DOSE REDUCTION: This exam was performed according to the departmental dose-optimization program which includes automated exposure control, adjustment of the mA and/or kV according to patient size and/or use of iterative reconstruction technique. CONTRAST:  87m OMNIPAQUE IOHEXOL 350 MG/ML SOLN COMPARISON:  CT abdomen pelvis 02/16/2004 FINDINGS: CHEST: Cardiovascular: No aortic injury. The thoracic aorta is normal in caliber. The heart is normal in size. No significant pericardial effusion. Likely mitral annular calcifications. Aortic valve leaflet calcifications. Moderate atherosclerotic plaque of the aorta. Left anterior descending coronary calcification. The main pulmonary artery is normal in caliber. No central pulmonary embolus. Mediastinum/Nodes: No pneumomediastinum. No mediastinal hematoma. The esophagus is unremarkable.  Small hiatal hernia. Subcentimeter thyroid hypodense nodules. Not clinically significant; no follow-up imaging recommended (ref: J Am Coll Radiol. 2015 Feb;12(2): 143-50). The central airways are patent. No mediastinal, hilar, or axillary lymphadenopathy. Lungs/Pleura: No focal consolidation. No pulmonary nodule. No pulmonary mass. No pulmonary contusion or laceration. No pneumatocele formation. No pleural effusion. No pneumothorax. No hemothorax. Musculoskeletal/Chest wall: No chest wall mass.  Left mastectomy. Diffusely decreased bone density. No acute rib or sternal fracture. Old healed bilateral rib fractures. No spinal fracture. Rotator cuff anchor suture along the left humeral head. Severe degenerative changes of the bilateral shoulders. ABDOMEN / PELVIS: Hepatobiliary: Not enlarged. No focal  lesion. No laceration or subcapsular hematoma. Status post cholecystectomy.  Pneumobilia. Pancreas: Normal pancreatic contour. No main pancreatic duct dilatation. Spleen: Not enlarged. No focal  lesion. No laceration, subcapsular hematoma, or vascular injury. Adrenals/Urinary Tract: No nodularity bilaterally. Bilateral kidneys enhance symmetrically. No hydronephrosis. No contusion, laceration, or subcapsular hematoma. Subcentimeter hypodensities too small to characterize-no further follow-up indicated. No injury to the vascular structures or collecting systems. No hydroureter. The urinary bladder is unremarkable. Stomach/Bowel: No small or large bowel wall thickening or dilatation. Diffuse sigmoid diverticulosis. Otherwise scattered colonic diverticulosis. The appendix is not definitely identified with no inflammatory changes in the right lower quadrant to suggest acute appendicitis. Vasculature/Lymphatics: No abdominal aorta or iliac aneurysm. No active contrast extravasation or pseudoaneurysm. No abdominal, pelvic, inguinal lymphadenopathy. Reproductive: Normal. Other: No simple free fluid ascites. No pneumoperitoneum. No hemoperitoneum. No mesenteric hematoma identified. No organized fluid collection. Musculoskeletal: No significant soft tissue hematoma. Bilateral small fat containing inguinal hernias. Diffusely decreased bone density. No acute pelvic fracture. Old healed right hip fracture. No spinal fracture. Grade 2 anterolisthesis of L5 on S1. Grade 1 anterolisthesis of L4 on L5. Ports and Devices: None. IMPRESSION: 1. No acute traumatic injury to the chest, abdomen, or pelvis. 2. No acute fracture or traumatic malalignment of the thoracic or lumbar spine. 3. Other imaging findings of potential clinical significance: Colonic diverticulosis with no acute diverticulitis. Bilateral fat containing inguinal hernias. Aortic Atherosclerosis (ICD10-I70.0) including likely mitral annular, coronary artery, as well as aortic valve leaflet calcifications-correlate for aortic stenosis. Electronically Signed   By: Iven Finn M.D.   On: 02/25/2022 19:51   CT CERVICAL SPINE WO CONTRAST  Result Date:  02/25/2022 CLINICAL DATA:  Unwitnessed fall EXAM: CT CERVICAL SPINE WITHOUT CONTRAST TECHNIQUE: Multidetector CT imaging of the cervical spine was performed without intravenous contrast. Multiplanar CT image reconstructions were also generated. RADIATION DOSE REDUCTION: This exam was performed according to the departmental dose-optimization program which includes automated exposure control, adjustment of the mA and/or kV according to patient size and/or use of iterative reconstruction technique. COMPARISON:  None Available. FINDINGS: Alignment: Normal. Skull base and vertebrae: Craniocervical alignment is normal. The atlantodental interval is not widened. No acute fracture of the cervical spine. No lytic or blastic bone lesion. Soft tissues and spinal canal: No prevertebral fluid or swelling. No visible canal hematoma. There is extensive atherosclerotic calcification within the carotid bifurcations bilaterally. Disc levels: There is intervertebral disc space narrowing and endplate remodeling throughout the cervical spine, most severe at C5-C7 in keeping with changes of mild to moderate degenerative disc disease. Prevertebral soft tissues are not thickened on sagittal reformats. Spinal canal is widely patent. Multilevel uncovertebral and facet arthrosis results in mild multilevel neuroforaminal narrowing, most severe at C5-C7. Upper chest: Negative. Other: None IMPRESSION: 1. No acute fracture or listhesis of the cervical spine. 2. Extensive atherosclerotic calcification within the carotid bifurcations bilaterally. If indicated, the degree of stenosis would be better assessed with dedicated carotid artery Doppler sonography. Electronically Signed   By: Fidela Salisbury M.D.   On: 02/25/2022 19:42   CT HEAD WO CONTRAST  Result Date: 02/25/2022 CLINICAL DATA:  Trauma EXAM: CT HEAD WITHOUT CONTRAST TECHNIQUE: Contiguous axial images were obtained from the base of the skull through the vertex without intravenous  contrast. RADIATION DOSE REDUCTION: This exam was performed according to the departmental dose-optimization program which includes automated exposure control, adjustment of the mA and/or kV according to patient size and/or use of iterative reconstruction technique. COMPARISON:  None Available. FINDINGS: Brain:  There is a very small amount of subarachnoid hemorrhage in the anterior left frontal lobe image 4/16 and 4/20 there is no mass effect, midline shift or extra-axial fluid collection. There is no acute infarct. There is mild diffuse atrophy. There is moderate periventricular and deep white matter hypodensity, likely chronic small vessel ischemic change. Vascular: Atherosclerotic calcifications are present within the cavernous internal carotid arteries. Skull: Negative for skull fracture. Fractures of the left maxilla and left orbit noted. Please see dedicated CT maxillofacial for further description. Sinuses/Orbits: There is hemorrhage/air-fluid level in the left maxillary sinus. Bilateral mastoid effusions are present. Other: Left periorbital swelling. IMPRESSION: 1. Very small amount of subarachnoid hemorrhage in the anterior left frontal lobe. No mass effect or midline shift. 2. Atrophy and chronic small vessel ischemic changes. 3. Fractures of the left maxilla and left orbit. Please see dedicated CT maxillofacial for further description. Electronically Signed   By: Ronney Asters M.D.   On: 02/25/2022 19:39   DG Humerus Left  Result Date: 02/25/2022 CLINICAL DATA:  Trauma to the left upper extremity. EXAM: LEFT SHOULDER; LEFT HUMERUS - 2+ VIEW COMPARISON:  Chest radiograph dated 02/25/2022. FINDINGS: No acute fracture or dislocation of the left shoulder. The bones are osteopenic. There is degenerative changes with calcific tendinopathy of the supraspinatus muscle. Humeral head anchor pins noted. Multiple old appearing left rib fractures. Several surgical clips noted over the left axilla. IMPRESSION: 1. No  acute fracture or dislocation of the left shoulder or left humerus. 2. Osteopenia and degenerative changes. Electronically Signed   By: Anner Crete M.D.   On: 02/25/2022 18:52   DG Shoulder Left Port  Result Date: 02/25/2022 CLINICAL DATA:  Trauma to the left upper extremity. EXAM: LEFT SHOULDER; LEFT HUMERUS - 2+ VIEW COMPARISON:  Chest radiograph dated 02/25/2022. FINDINGS: No acute fracture or dislocation of the left shoulder. The bones are osteopenic. There is degenerative changes with calcific tendinopathy of the supraspinatus muscle. Humeral head anchor pins noted. Multiple old appearing left rib fractures. Several surgical clips noted over the left axilla. IMPRESSION: 1. No acute fracture or dislocation of the left shoulder or left humerus. 2. Osteopenia and degenerative changes. Electronically Signed   By: Anner Crete M.D.   On: 02/25/2022 18:52   DG Chest Port 1 View  Result Date: 02/25/2022 CLINICAL DATA:  Trauma EXAM: PORTABLE CHEST 1 VIEW COMPARISON:  12/20/2021, 12/13/2021 FINDINGS: Heart size is stable. No focal airspace consolidation, pleural effusion, or pneumothorax. Multiple chronic bilateral rib fractures. No definite acute rib fracture is seen. IMPRESSION: 1. No acute findings within the chest. 2. Multiple chronic bilateral rib fractures. No definite acute rib fracture is seen. Electronically Signed   By: Davina Poke D.O.   On: 02/25/2022 18:36   DG Pelvis Portable  Result Date: 02/25/2022 CLINICAL DATA:  Trauma. EXAM: PORTABLE PELVIS 1-2 VIEWS COMPARISON:  None Available. FINDINGS: No fracture.  No bone lesion. Hip joints, SI joints and pubic symphysis are normally aligned. Skeletal structures are demineralized. Soft tissues are unremarkable. IMPRESSION: No fracture or acute finding. Electronically Signed   By: Lajean Manes M.D.   On: 02/25/2022 18:36    Anti-infectives: Anti-infectives (From admission, onward)    None        Assessment/Plan HD#3 - 89 y.o.  female fall on level ground  Small subarachnoid hemorrhage frontal lobe - ok to continue anticoagulation per neurosurgery. This is being held given multiple recent falls with plans to discuss when/if to restart with PCP - no repeat  CT unless change in neuro status Left facial tripod fractures - per ENT, pain control, no nose blowing, outpatient follow up Left wrist distal radius and ulna fractures - per ortho, Dr. Percell Miller, NWB LUE in splint, follow up 1 week Left facial laceration - sutured in ER 2/10 with prolenes, need to be removed in about 7 days   ABL anemia - hgb 10.6 yesterday from 14.3. no hypotension or tachycardia but may want to recheck, per primary History of DVT on Xarelto Hyponatremia HTN   PT and OT consults   Disposition - PT/OT recommending home health. Pain well controlled on orals. Lives at home with daughter and son in law. No further recommendations from trauma, we will sign off. Please call with questions or concerns.  I reviewed hospitalist notes, last 24 h vitals and pain scores, last 48 h intake and output, and last 24 h labs and trends.    LOS: 0 days    Wellington Hampshire, [facility] 02/27/2022, 9:00 AM Please see Amion for pager number during day hours 7:00am-4:30pm

## 2022-02-27 NOTE — Consult Note (Signed)
Consultation Note Date: 02/27/2022   Patient Name: Stephanie Pearson  DOB: 05-Sep-1927  MRN: [id number]  Age / Sex: 89 y.o., female  PCP: Merry Lofty, NP-C Referring Physician: Karie Kirks, DO  Reason for Consultation: {Reason for Consult:23484}  HPI/Patient Profile: 89 y.o. female  with past medical history of *** admitted on 02/25/2022 with ***.   Clinical Assessment and Goals of Care: ***  Primary Decision Maker {Primary Decision [id number]    SUMMARY OF RECOMMENDATIONS   ***  Code Status/Advance Care Planning: {Palliative Code status:23503}   Symptom Management:  ***  Palliative Prophylaxis:  {Palliative Prophylaxis:21015}  Additional Recommendations (Limitations, Scope, Preferences): {Recommended Scope and Preferences:21019}  Psycho-social/Spiritual:  Desire for further Chaplaincy support:{YES NO:22349} Additional Recommendations: {PAL SOCIAL:21064}  Prognosis:  {Palliative Care Prognosis:23504}  Discharge Planning: {Palliative dispostion:23505}      Primary Diagnoses: Present on Admission:  Hyponatremia  Essential hypertension  Closed fracture of tripod, initial encounter ([place])  Wrist fracture, closed, left, initial encounter   I have reviewed the medical record, interviewed the patient and family, and examined the patient. The following aspects are pertinent.  Past Medical History:  Diagnosis Date   Cancer [facility])    History of blood clots    Hypertension    Hyponatremia 02/25/2022   Personal history of chemotherapy 1999   Social History   Socioeconomic History   Marital status: Widowed    Spouse name: Not on file   Number of children: Not on file   Years of education: Not on file   Highest education level: Not on file  Occupational History   Not on file  Tobacco Use   Smoking status: Never   Smokeless tobacco: Never  Substance and Sexual Activity    Alcohol use: No   Drug use: No   Sexual activity: Never  Other Topics Concern   Not on file  Social History Narrative   Not on file   Social Determinants of Health   Financial Resource Strain: Not on file  Food Insecurity: Not on file  Transportation Needs: Not on file  Physical Activity: Not on file  Stress: Not on file  Social Connections: Not on file   Family History  Problem Relation Age of Onset   Breast cancer Sister    Scheduled Meds:  amLODipine  2.5 mg Oral Daily   lisinopril  40 mg Oral Daily   melatonin  3 mg Oral QHS   metoprolol tartrate  25 mg Oral BID   pantoprazole  40 mg Oral Daily   pravastatin  40 mg Oral q1800   sodium chloride flush  3 mL Intravenous Q12H   Continuous Infusions:  sodium chloride     PRN Meds:.acetaminophen **OR** acetaminophen, bisacodyl, ondansetron **OR** ondansetron (ZOFRAN) IV, oxyCODONE, polyethylene glycol, sodium chloride Allergies  Allergen Reactions   Prilosec [Omeprazole] Rash   Review of Systems  Physical Exam  Vital Signs: BP (!) 104/51   Pulse 65   Temp 97.9 F (36.6 C) (Oral)   Resp 18   Ht  $5' 5"H$  (1.651 m)   Wt 59.7 kg   SpO2 100%   BMI 21.90 kg/m  Pain Scale: 0-10 POSS *See Group Information*: 1-Acceptable,Awake and alert Pain Score: 3    SpO2: SpO2: 100 % O2 Device:SpO2: 100 % O2 Flow Rate: .   IO: Intake/output summary:  Intake/Output Summary (Last 24 hours) at 02/27/2022 1706 Last data filed at 02/27/2022 1600 Gross per 24 hour  Intake 740 ml  Output 1200 ml  Net -460 ml    LBM: Last BM Date : 02/26/22 Baseline Weight: Weight: 59.7 kg Most recent weight: Weight: 59.7 kg     Palliative Assessment/Data:     Time In: *** Time Out: *** Time Total: *** Greater than 50%  of this time was spent counseling and coordinating care related to the above assessment and plan.  Signed by: Vinie Sill, NP Palliative Medicine Team Pager # [phone] (M-F 8a-5p) Team Phone # [phone]  (Nights/Weekends)

## 2022-02-27 NOTE — Progress Notes (Signed)
Physical Therapy Treatment Patient Details Name: Stephanie Pearson MRN: [id number] DOB: [birth year] Today's Date: 02/27/2022   History of Present Illness Pt is a 89 y/o female presenting after ground level fall. Found with small subarachnoid hemorrhage of frontal lobe, L facial tripod fractures, L radius, ulnar, and styloid fx. PMH - lt breast CA, HTN, DVT    PT Comments    Pt greeted supine in bed and eager for OOB mobility. Pt able to come to sitting EOB with light min assist to elevate trunk and maintain, pt with good recall of WB precautions and adherence throughout session with sling dinned. Encouraged pt and educated pt daughter on sling wear to reinforce NWB status of LUE with pt and pt daughter verbalizing understanding. Pt continues to require increased assist to power up to stand from EOB at lowest height. Pt demonstrating increased tolerance for gait with quad cane on R and min assist to steady and correct x1 LOB. Current plan remains appropriate to address deficits and maximize functional independence and safety. Anticipate safe discharge, with assist level outlined below, once medically cleared, will continue to follow acutely.    Recommendations for follow up therapy are one component of a multi-disciplinary discharge planning process, led by the attending physician.  Recommendations may be updated based on patient status, additional functional criteria and insurance authorization.  Follow Up Recommendations  Home health PT     Assistance Recommended at Discharge Frequent or constant Supervision/Assistance  Patient can return home with the following A little help with walking and/or transfers;A little help with bathing/dressing/bathroom;Assistance with cooking/housework;Help with stairs or ramp for entrance   Equipment Recommendations  None recommended by PT    Recommendations for Other Services       Precautions / Restrictions Precautions Precautions: Fall Required Braces or  Orthoses: Sling;Splint/Cast Splint/Cast: Lt forearm/wrist splint Restrictions Weight Bearing Restrictions: Yes (left arm) LUE Weight Bearing: Non weight bearing     Mobility  Bed Mobility Overal bed mobility: Needs Assistance Bed Mobility: Supine to Sit     Supine to sit: Min assist, HOB elevated     General bed mobility comments: light assist to pull up on therapist's hand to elevate trunk into sitting    Transfers Overall transfer level: Needs assistance Equipment used: Quad cane Transfers: Sit to/from Stand Sit to Stand: Mod assist           General transfer comment: mod a to facilaite anterior weight shift and power up to stand    Ambulation/Gait Ambulation/Gait assistance: Min assist, Min guard Gait Distance (Feet): 85 Feet Assistive device: Quad cane Gait Pattern/deviations: Decreased step length - right, Decreased step length - left, Shuffle, Step-through pattern Gait velocity: decr     General Gait Details: Assist for balance and support, pt with mild LOB needing light min asssit to correct,   Stairs             Wheelchair Mobility    Modified Rankin (Stroke Patients Only)       Balance Overall balance assessment: Needs assistance Sitting-balance support: No upper extremity supported, Feet supported Sitting balance-Leahy Scale: Fair     Standing balance support: Single extremity supported, During functional activity Standing balance-Leahy Scale: Poor Standing balance comment: UE support and min assist                            Cognition Arousal/Alertness: Awake/alert Behavior During Therapy: WFL for tasks assessed/performed Overall Cognitive Status: History of  cognitive impairments - at baseline                                 General Comments: Mild memory impairment per daughter, appears WFL, pt able to recall NWB status of LUE        Exercises      General Comments General comments (skin integrity,  edema, etc.): VSS on RA, encouraged pt to wear sling for comfort and to reinforce NWB of LUE      Pertinent Vitals/Pain Pain Assessment Pain Assessment: No/denies pain    Home Living                          Prior Function            PT Goals (current goals can now be found in the care plan section) Acute Rehab PT Goals Patient Stated Goal: return home PT Goal Formulation: With patient/family Time For Goal Achievement: 03/12/22 Progress towards PT goals: Progressing toward goals    Frequency    Min 3X/week      PT Plan      Co-evaluation              AM-PAC PT "6 Clicks" Mobility   Outcome Measure  Help needed turning from your back to your side while in a flat bed without using bedrails?: A Little Help needed moving from lying on your back to sitting on the side of a flat bed without using bedrails?: A Little Help needed moving to and from a bed to a chair (including a wheelchair)?: A Little Help needed standing up from a chair using your arms (e.g., wheelchair or bedside chair)?: A Lot Help needed to walk in hospital room?: A Little Help needed climbing 3-5 steps with a railing? : A Lot 6 Click Score: 16    End of Session Equipment Utilized During Treatment: Gait belt;Other (comment) (LUE sling) Activity Tolerance: Patient tolerated treatment well Patient left: with family/visitor present;Other (comment) (up at sink with OT) Nurse Communication: Mobility status PT Visit Diagnosis: Unsteadiness on feet (R26.81);Other abnormalities of gait and mobility (R26.89);History of falling (Z91.81)     Time: 1013-1030 PT Time Calculation (min) (ACUTE ONLY): 17 min  Charges:  $Gait Training: 8-22 mins                     Zariyah Stephens R. PTA Acute Rehabilitation Services Office: [place] 02/27/2022, 10:43 AM

## 2022-02-27 NOTE — Assessment & Plan Note (Signed)
The patient became acutely dizzy today with walking. Her heart rate also dropped to 30's and 40's. She was given a 500 cc bolus with improvement of her blood pressure to the 100's and her heart rate to the 100's. I have reduced her dose of metoprolol. Will monitor her overnight and try to walk her again tomorrow.

## 2022-02-28 ENCOUNTER — Encounter (HOSPITAL_COMMUNITY): Payer: Self-pay | Admitting: Family Medicine

## 2022-02-28 DIAGNOSIS — E785 Hyperlipidemia, unspecified: Secondary | ICD-10-CM | POA: Diagnosis present

## 2022-02-28 DIAGNOSIS — Z803 Family history of malignant neoplasm of breast: Secondary | ICD-10-CM | POA: Diagnosis not present

## 2022-02-28 DIAGNOSIS — S066XAA Traumatic subarachnoid hemorrhage with loss of consciousness status unknown, initial encounter: Secondary | ICD-10-CM | POA: Diagnosis present

## 2022-02-28 DIAGNOSIS — S0240FA Zygomatic fracture, left side, initial encounter for closed fracture: Secondary | ICD-10-CM | POA: Diagnosis present

## 2022-02-28 DIAGNOSIS — R58 Hemorrhage, not elsewhere classified: Secondary | ICD-10-CM | POA: Diagnosis present

## 2022-02-28 DIAGNOSIS — S52615A Nondisplaced fracture of left ulna styloid process, initial encounter for closed fracture: Secondary | ICD-10-CM | POA: Diagnosis present

## 2022-02-28 DIAGNOSIS — Z9221 Personal history of antineoplastic chemotherapy: Secondary | ICD-10-CM | POA: Diagnosis not present

## 2022-02-28 DIAGNOSIS — Y92009 Unspecified place in unspecified non-institutional (private) residence as the place of occurrence of the external cause: Secondary | ICD-10-CM | POA: Diagnosis not present

## 2022-02-28 DIAGNOSIS — S01112A Laceration without foreign body of left eyelid and periocular area, initial encounter: Secondary | ICD-10-CM | POA: Diagnosis present

## 2022-02-28 DIAGNOSIS — I609 Nontraumatic subarachnoid hemorrhage, unspecified: Secondary | ICD-10-CM | POA: Diagnosis not present

## 2022-02-28 DIAGNOSIS — Z23 Encounter for immunization: Secondary | ICD-10-CM | POA: Diagnosis not present

## 2022-02-28 DIAGNOSIS — Z7189 Other specified counseling: Secondary | ICD-10-CM | POA: Diagnosis not present

## 2022-02-28 DIAGNOSIS — K5900 Constipation, unspecified: Secondary | ICD-10-CM | POA: Diagnosis present

## 2022-02-28 DIAGNOSIS — Z86718 Personal history of other venous thrombosis and embolism: Secondary | ICD-10-CM | POA: Diagnosis not present

## 2022-02-28 DIAGNOSIS — I10 Essential (primary) hypertension: Secondary | ICD-10-CM | POA: Diagnosis present

## 2022-02-28 DIAGNOSIS — E871 Hypo-osmolality and hyponatremia: Secondary | ICD-10-CM | POA: Diagnosis present

## 2022-02-28 DIAGNOSIS — S62102D Fracture of unspecified carpal bone, left wrist, subsequent encounter for fracture with routine healing: Secondary | ICD-10-CM | POA: Diagnosis not present

## 2022-02-28 DIAGNOSIS — D62 Acute posthemorrhagic anemia: Secondary | ICD-10-CM | POA: Diagnosis not present

## 2022-02-28 DIAGNOSIS — W1830XA Fall on same level, unspecified, initial encounter: Secondary | ICD-10-CM | POA: Diagnosis present

## 2022-02-28 DIAGNOSIS — Z66 Do not resuscitate: Secondary | ICD-10-CM | POA: Diagnosis present

## 2022-02-28 DIAGNOSIS — R296 Repeated falls: Secondary | ICD-10-CM | POA: Diagnosis present

## 2022-02-28 DIAGNOSIS — Z9012 Acquired absence of left breast and nipple: Secondary | ICD-10-CM | POA: Diagnosis not present

## 2022-02-28 DIAGNOSIS — Z515 Encounter for palliative care: Secondary | ICD-10-CM | POA: Diagnosis not present

## 2022-02-28 DIAGNOSIS — S0240DA Maxillary fracture, left side, initial encounter for closed fracture: Secondary | ICD-10-CM | POA: Diagnosis present

## 2022-02-28 DIAGNOSIS — R001 Bradycardia, unspecified: Secondary | ICD-10-CM | POA: Diagnosis present

## 2022-02-28 DIAGNOSIS — S52502A Unspecified fracture of the lower end of left radius, initial encounter for closed fracture: Secondary | ICD-10-CM | POA: Diagnosis present

## 2022-02-28 DIAGNOSIS — E876 Hypokalemia: Secondary | ICD-10-CM | POA: Diagnosis not present

## 2022-02-28 DIAGNOSIS — S0181XA Laceration without foreign body of other part of head, initial encounter: Secondary | ICD-10-CM | POA: Diagnosis present

## 2022-02-28 DIAGNOSIS — I951 Orthostatic hypotension: Secondary | ICD-10-CM | POA: Diagnosis present

## 2022-02-28 MED ORDER — METOPROLOL TARTRATE 12.5 MG HALF TABLET
12.5000 mg | ORAL_TABLET | Freq: Two times a day (BID) | ORAL | Status: DC
  Administered 2022-02-28 – 2022-03-01 (×3): 12.5 mg via ORAL
  Filled 2022-02-28 (×3): qty 1

## 2022-02-28 NOTE — Progress Notes (Signed)
PROGRESS NOTE  Stephanie Pearson [id number] DOB: May 05, 1927 DOA: 02/25/2022 PCP: Merry Lofty, NP-C  Brief History   Mrs. Figueras is a 89 y.o. F lives alone, hx HTN, DVT on Xarelto who fell in her kitchen.  In the ER, found to have small SAH, fracture of facial tripod and wrist fracture.  She has been placed in a sugar-tong and a sling. The plan is for to discharge to home with home health. She is not to blow her nose, and she is to follow up with ophthalmology as outpatient. She has plans to follow up with Dr. Lucianne Lei. Xarelto has been held and will be held at discharge.   She was to have discharged to home today, but when she was gotten up to walk in the halls, she became dizzy. She was found to be hypotensive with a systolic of 83 and a heart rate in the 30's and 40's. A rapid response was called. She was given a 500 cc bolus with improvement in her SP. Heart rate is now 109.  Repeat CT head was negative for any acute changes.  Metoprolol was decreased to 25 mg on 02/27/2022 and then to 12/5 on the morning of 02/28/2022. She was also given cautious continuous fluids.   She was able to ambulate in the halls without difficulty this afternoon. Anticipate discharge to home in the am so that we may monitor her on lower dose of metoprolol.  A & P  SAH (subarachnoid hemorrhage) [facility]) Neurosurgery consulted.  They recommend repeat CTH if neurological changes, otherwise no specific follow up needed.  So far, she is neurologically intact, no changes. - Hold home Xarelto - Defer timing of restart to PCP after discharge -- Repeat CT head did not demonstrate any acute changes.   Closed fracture of tripod, initial encounter ([place]) - No nose blowing - Analgesics as needed - Follow up with Ophthalmology after discharge, Dr. Lucianne Lei info in discharge instructions  Essential hypertension - Continue amlodipine, lisinopril, metoprolol  Hyponatremia Mild, asymptomatic.  No work up needed  Fall at  home, initial encounter Patient able to recount to me in detail her fall, was mechanical in nature, no LOC/syncope/near syncope.  Wrist fracture, closed, left, initial encounter - Maintain in sugartong and sling - Follow up with Dr. Percell Miller in 1 week  History of DVT (deep vein thrombosis) - Hold Xarelto at discharge  Orthostatic hypotension The patient became acutely dizzy today with walking. Her heart rate also dropped to 30's and 40's. She was given a 500 cc bolus with improvement of her blood pressure to the 100's and her heart rate to the 100's. Her metoprolol has now been reduced to 12.5 bid. Will continue to monitor. She appears to have been able to ambulate in the halls without difficulty.  DVT prophylaxis: AC held due to [facility] Code Status: Full Code Family Communication: Daughter was at bedside Disposition Plan: Home with home health    Loukisha Gunnerson, DO [facility] Direct contact: see [email or web address]  7PM-7AM contact night coverage as above 02/28/2022, 5:33 PM  LOS: 0 days   Consultants  Neurosurgery Orthopedic surgery  Procedures  None  Antibiotics  None  Subjective  The patient is resting comfortably. No new complaints.  Objective   Vitals:  Vitals:   02/28/22 1114 02/28/22 1143  BP: 118/66 (!) 143/66  Pulse:  (!) 58  Resp:  20  Temp:  97.9 F (36.6 C)  SpO2:  100%    Exam:  Constitutional:  Appears  calm and comfortable Eyes:  pupils and irises appear normal Normal lids and conjunctivae Respiratory:  CTA bilaterally, no w/r/r.  Respiratory effort normal. No retractions or accessory muscle use Cardiovascular:  RRR, no m/r/g No LE extremity edema   Normal pedal pulses Abdomen:  Abdomen appears normal; no tenderness or masses No hernias No HSM Musculoskeletal:  Digits/nails BUE: no clubbing, cyanosis, petechiae, infection exam of joints, bones, muscles of at least one of following: head/neck, RUE, LUE, RLE, LLE   strength and tone normal,  no atrophy, no abnormal movements No tenderness, masses Normal ROM, no contractures  gait and station Skin:  No rashes, lesions, ulcers palpation of skin: no induration or nodules Neurologic:  CN 2-12 intact Sensation all 4 extremities intact Psychiatric:  Mental status Mood, affect appropriate Orientation to person, place, time    I have personally reviewed the following:   Today's Data  CBC    Component Value Date/Time   WBC 10.2 02/26/2022 0141   RBC 3.70 (L) 02/26/2022 0141   HGB 10.6 (L) 02/26/2022 0141   HCT 31.6 (L) 02/26/2022 0141   PLT 179 02/26/2022 0141   MCV 85.4 02/26/2022 0141   MCH 28.6 02/26/2022 0141   MCHC 33.5 02/26/2022 0141   RDW 14.9 02/26/2022 0141      Latest Ref Rng & Units 02/26/2022    1:41 AM 02/25/2022    6:32 PM 02/25/2022    6:17 PM  BMP  Glucose 70 - 99 mg/dL 111  124  126   BUN 8 - 23 mg/dL 10  14  12   $ Creatinine 0.44 - 1.00 mg/dL 0.84  0.70  0.87   Sodium 135 - 145 mmol/L 131  129  128   Potassium 3.5 - 5.1 mmol/L 3.4  4.1  4.2   Chloride 98 - 111 mmol/L 99  94  93   CO2 22 - 32 mmol/L 21   24   Calcium 8.9 - 10.3 mg/dL 8.4   9.0      Imaging  CT head ordered due to acute hypotension and bradycardia with ambulation.  Scheduled Meds:  amLODipine  2.5 mg Oral Daily   lisinopril  40 mg Oral Daily   melatonin  3 mg Oral QHS   metoprolol tartrate  12.5 mg Oral BID   pantoprazole  40 mg Oral Daily   pravastatin  40 mg Oral q1800   sodium chloride flush  3 mL Intravenous Q12H   Continuous Infusions:  sodium chloride      Principal Problem:   SAH (subarachnoid hemorrhage) (HCC) Active Problems:   Orthostatic hypotension   Closed fracture of tripod, initial encounter (HCC)   Wrist fracture, closed, left, initial encounter   Essential hypertension   Hyponatremia   History of DVT (deep vein thrombosis)   Fall at home, initial encounter   Subarachnoid hemorrhage following injury ([place])   LOS: 0 days   Shelley Pooley,  DO [facility] February 28, 2022

## 2022-02-28 NOTE — Progress Notes (Signed)
Palliative:  HPI: 89 y.o. female  with past medical history of hypertension, hyponatremia, DVT on Xarelto admitted on 02/25/2022 with fall and found to have SAH, L facial tripod fractures, L wrist distal radius and ulna fractures.    I discussed briefly with CMRN and plans for likely home with home health. Ms. Shonka is sitting up in recliner. She reports that she is feeling better. Head still feels "funny" but denies dizziness or headache - improved from yesterday she says. Daughter, Olegario Shearer, is at bedside and confirms plan to take her mother home. Olegario Shearer shares that they have had further discussions regarding code status as family. With plans for likely home today Vicky reports that this conversation and decision is likely not needed now with plans for upcoming discharge. I explained that this decision is still important as they would need DNR form signed by a provider to have in the home - just in case of sudden tragedy to protect her from these measures if they are not desired. I reassured Vicky that I am not pressuring them to make a decision today but I do feel it is important and they can tell any of Ms. Gosling's providers of her decision if DNR decided so they can provide the appropriate paperwork. Vicky verbalizes understanding. I also provided her with Hard Choices booklet.   All questions/concerns addressed. Emotional support provided. Discussed with CMRN.   Exam: Alert, oriented. Sitting up in recliner. No distress. VSS. HR RRR. Breathing regular, unlabored. L periorbital bruising and small laceration with slight edema.   Plan: - Recommend ongoing goals of care conversations and conversation regarding code status.   25 min  Vinie Sill, NP Palliative Medicine Team Pager [phone] (Please see [email or web address] for schedule) Team Phone [phone]    Greater than 50%  of this time was spent counseling and coordinating care related to the above assessment and plan

## 2022-02-28 NOTE — Progress Notes (Signed)
Physical Therapy Treatment Patient Details Name: Stephanie Pearson MRN: [id number] DOB: Mar 20, 1927 Today's Date: 02/28/2022   History of Present Illness Pt is a 89 y/o female presenting after ground level fall. Found with small subarachnoid hemorrhage of frontal lobe, L facial tripod fractures, L radius, ulnar, and styloid fx. PMH - lt breast CA, HTN, DVT    PT Comments    Patient progressing well towards PT goals. Session focused on functional mobility and progressive ambulation with use of quad cane. Requires Min A to stand from EOB and Min guard-MIn A for gait training with only mild unsteadiness with turns. Provided gait belt for daughter to use at home. Daughter reports pt will be sleeping in recliner at home (lift chair). BP stable today with no reports of dizziness/lightheadedness. Encouraged getting up and walking to bathroom with staff today per daughter request to improve strength and prepare for d/c home.  Sitting BP pre activity 132/69, post activity BP 144/72. Will follow.   Recommendations for follow up therapy are one component of a multi-disciplinary discharge planning process, led by the attending physician.  Recommendations may be updated based on patient status, additional functional criteria and insurance authorization.  Follow Up Recommendations  Home health PT     Assistance Recommended at Discharge Frequent or constant Supervision/Assistance  Patient can return home with the following A little help with walking and/or transfers;A little help with bathing/dressing/bathroom;Assistance with cooking/housework;Help with stairs or ramp for entrance   Equipment Recommendations  None recommended by PT    Recommendations for Other Services       Precautions / Restrictions Precautions Precautions: Fall Required Braces or Orthoses: Sling;Splint/Cast Splint/Cast: Lt forearm/wrist splint Restrictions Weight Bearing Restrictions: Yes LUE Weight Bearing: Non weight bearing      Mobility  Bed Mobility Overal bed mobility: Needs Assistance Bed Mobility: Supine to Sit     Supine to sit: Min assist, HOB elevated     General bed mobility comments: Assist with trunk to get to EOB.    Transfers Overall transfer level: Needs assistance Equipment used: Quad cane Transfers: Sit to/from Stand Sit to Stand: Min assist           General transfer comment: Min A to power to standing with cues for anterior weight shift/momentum, uses lift chair at home.    Ambulation/Gait Ambulation/Gait assistance: Min assist, Min guard Gait Distance (Feet): 140 Feet Assistive device: Quad cane Gait Pattern/deviations: Decreased step length - right, Decreased step length - left, Shuffle, Step-through pattern Gait velocity: decr Gait velocity interpretation: <1.31 ft/sec, indicative of household ambulator   General Gait Details: Slow, mildly unsteady gait with use of cane for support, close min guard for safety, mild unsteadiness with turns but no overt LOB. No dizziness.   Stairs             Wheelchair Mobility    Modified Rankin (Stroke Patients Only)       Balance Overall balance assessment: Needs assistance Sitting-balance support: No upper extremity supported, Feet supported Sitting balance-Leahy Scale: Fair     Standing balance support: During functional activity, Single extremity supported Standing balance-Leahy Scale: Fair Standing balance comment: can statically stand at sink but needs UE support for walking                            Cognition Arousal/Alertness: Awake/alert Behavior During Therapy: WFL for tasks assessed/performed Overall Cognitive Status: History of cognitive impairments - at baseline  General Comments: Mild memory impairment per daughter, appears WFL, pt able to recall NWB status of LUE        Exercises      General Comments General comments (skin integrity,  edema, etc.): Daughter arrived at end of session. Discussed concerns about home and problem solved some safety issues re: sleeping in lift chair/recliner, how much assist she needs, provided gait belt for support etc.      Pertinent Vitals/Pain Pain Assessment Pain Assessment: Faces Faces Pain Scale: Hurts little more Pain Location: left side of face Pain Descriptors / Indicators: Sore Pain Intervention(s): Monitored during session, Repositioned    Home Living                          Prior Function            PT Goals (current goals can now be found in the care plan section) Progress towards PT goals: Progressing toward goals    Frequency    Min 3X/week      PT Plan Current plan remains appropriate    Co-evaluation              AM-PAC PT "6 Clicks" Mobility   Outcome Measure  Help needed turning from your back to your side while in a flat bed without using bedrails?: A Little Help needed moving from lying on your back to sitting on the side of a flat bed without using bedrails?: A Little Help needed moving to and from a bed to a chair (including a wheelchair)?: A Little Help needed standing up from a chair using your arms (e.g., wheelchair or bedside chair)?: A Little Help needed to walk in hospital room?: A Little Help needed climbing 3-5 steps with a railing? : A Lot 6 Click Score: 17    End of Session Equipment Utilized During Treatment: Gait belt;Other (comment) (LUE sling) Activity Tolerance: Patient tolerated treatment well Patient left: in chair;with call bell/phone within reach;with family/visitor present;with chair alarm set Nurse Communication: Mobility status PT Visit Diagnosis: Unsteadiness on feet (R26.81);Other abnormalities of gait and mobility (R26.89);History of falling (Z91.81)     Time: [id number] PT Time Calculation (min) (ACUTE ONLY): 25 min  Charges:  $Gait Training: 8-22 mins $Therapeutic Activity: 8-22 mins                      Marisa Severin, PT, DPT Acute Rehabilitation Services Secure chat preferred Office [place] 02/28/2022, 10:02 AM

## 2022-02-28 NOTE — Progress Notes (Signed)
Mobility Specialist Progress Note   02/28/22 1500  Mobility  Activity Ambulated with assistance in hallway  Level of Assistance Contact guard assist, steadying assist (HHA-Arm Held)  Assistive Device Other (Comment) (HHA`)  Distance Ambulated (ft) 140 ft  Range of Motion/Exercises Active;All extremities  LUE Weight Bearing NWB  Activity Response Tolerated well   Patient received in supine, eager to participate. Requested assistance to [facility] prior to ambulation. Was independent for bed mobility and transferred to [facility] with supervision. Performed pericare independently and stood from [facility] with minimal HHA. Ambulated min guard with slow steady gait. Patient with improved activity tolerance, able to extend ambulatory distance this session. Returned to room without complaint or incident. Was left in supine with all needs met, call bell in reach.   Stephanie Pearson, BS EXP Mobility Specialist Please contact via SecureChat or Rehab office at [phone]

## 2022-03-01 DIAGNOSIS — I609 Nontraumatic subarachnoid hemorrhage, unspecified: Secondary | ICD-10-CM | POA: Diagnosis not present

## 2022-03-01 MED ORDER — POLYETHYLENE GLYCOL 3350 17 G PO PACK: 17.0000 g | PACK | Freq: Every day | ORAL | 0 refills | Status: AC | PRN

## 2022-03-01 MED ORDER — METOPROLOL TARTRATE 25 MG PO TABS: 12.5000 mg | ORAL_TABLET | Freq: Two times a day (BID) | ORAL | 2 refills | Status: AC

## 2022-03-01 MED ORDER — SENNA 8.6 MG PO TABS: 1.0000 | ORAL_TABLET | Freq: Every day | ORAL | 0 refills | Status: AC

## 2022-03-01 NOTE — Progress Notes (Signed)
Occupational Therapy Treatment Patient Details Name: Stephanie Pearson MRN: [id number] DOB: Jan 20, 1927 Today's Date: 03/01/2022   History of present illness Pt is a 89 y/o female presenting after ground level fall. Found with small subarachnoid hemorrhage of frontal lobe, L facial tripod fractures, L radius, ulnar, and styloid fx. PMH - lt breast CA, HTN, DVT   OT comments  Pt seen to address final ADL and general mobility mgmt at home in prep for planned DC home today. Pt/family politely declined OOB ADL practice d/t awaiting ortho tech for full splint removal and rewrapping so IV could be removed. Discussed ADL mgmt, stair mgmt, elevation of LUE, gait belt use and general gentle AROM exercises for digits. Continue to rec HHOT follow up at DC.   Recommendations for follow up therapy are one component of a multi-disciplinary discharge planning process, led by the attending physician.  Recommendations may be updated based on patient status, additional functional criteria and insurance authorization.    Follow Up Recommendations  Home health OT     Assistance Recommended at Discharge Frequent or constant Supervision/Assistance  Patient can return home with the following  A little help with walking and/or transfers;A lot of help with bathing/dressing/bathroom;Assistance with cooking/housework;Assist for transportation;Help with stairs or ramp for entrance   Equipment Recommendations  Other (comment) (quad cane, family searching online for it)    Recommendations for Other Services      Precautions / Restrictions Precautions Precautions: Fall Required Braces or Orthoses: Sling;Splint/Cast Splint/Cast: Lt forearm/wrist splint Restrictions Weight Bearing Restrictions: Yes LUE Weight Bearing: Non weight bearing       Mobility Bed Mobility Overal bed mobility: Needs Assistance             General bed mobility comments: politely declined d/t awaiting ortho tech to remove splint and  rewrap once RN able to remove IV that was placed under it    Transfers                   General transfer comment: declined     Balance                                           ADL either performed or assessed with clinical judgement   ADL Overall ADL's : Needs assistance/impaired                                       General ADL Comments: Collab with pt/daughter on stair mgmt, gait belt use (including hand placement, belt placement near hips, and tightening belt to fit pt), stair mgmt with family assisting as a "rail" on L side, assisting around elbow/waist to maximize safety. Discussed elevation, wiggling fingers and light use of squeeze balls (lower resistance). Hoped to address dressing tasks in prep for DC though family politely declined d/t awaiting ortho tech for splint assist to remove IV that was under it prior to DC (bandaging cut open)    Extremity/Trunk Assessment Upper Extremity Assessment Upper Extremity Assessment: LUE deficits/detail LUE Deficits / Details: L UE splinted from elbow to hand, edema in hand but sensation WFL.  Albe to raise arm from shoulder LUE Coordination: decreased fine motor;decreased gross motor   Lower Extremity Assessment Lower Extremity Assessment: Defer to PT evaluation  Vision   Vision Assessment?: No apparent visual deficits   Perception     Praxis      Cognition Arousal/Alertness: Awake/alert Behavior During Therapy: WFL for tasks assessed/performed Overall Cognitive Status: History of cognitive impairments - at baseline                                 General Comments: Mild memory impairment per daughter, appears WFL, pt able to recall NWB status of LUE        Exercises      Shoulder Instructions       General Comments      Pertinent Vitals/ Pain       Pain Assessment Pain Assessment: No/denies pain  Home Living                                           Prior Functioning/Environment              Frequency  Min 2X/week        Progress Toward Goals  OT Goals(current goals can now be found in the care plan section)  Progress towards OT goals: Progressing toward goals  Acute Rehab OT Goals Patient Stated Goal: home today, avoid syncopal episodes OT Goal Formulation: With patient Time For Goal Achievement: 03/12/22 Potential to Achieve Goals: Good ADL Goals Pt Will Perform Grooming: with set-up;with supervision;standing Pt Will Perform Upper Body Dressing: with supervision;with set-up;sitting Pt Will Perform Lower Body Dressing: with set-up;with supervision;sit to/from stand Pt Will Transfer to Toilet: with supervision;ambulating Pt Will Perform Toileting - Clothing Manipulation and hygiene: with supervision;with set-up;sit to/from stand  Plan Discharge plan remains appropriate    Co-evaluation                 AM-PAC OT "6 Clicks" Daily Activity     Outcome Measure   Help from another person eating meals?: A Little Help from another person taking care of personal grooming?: A Little Help from another person toileting, which includes using toliet, bedpan, or urinal?: A Little Help from another person bathing (including washing, rinsing, drying)?: A Lot Help from another person to put on and taking off regular upper body clothing?: A Little Help from another person to put on and taking off regular lower body clothing?: A Lot 6 Click Score: 16    End of Session    OT Visit Diagnosis: Other abnormalities of gait and mobility (R26.89);Muscle weakness (generalized) (M62.81);History of falling (Z91.81)   Activity Tolerance Patient tolerated treatment well   Patient Left in bed;with call bell/phone within reach;with bed alarm set;with family/visitor present   Nurse Communication Mobility status        Time: [id number] OT Time Calculation (min): 11 min  Charges: OT General Charges $OT  Visit: 1 Visit OT Treatments $Self Care/Home Management : 8-22 mins  Malachy Chamber, OTR/L Acute Rehab Services Office: [phone]   Layla Maw 03/01/2022, 11:53 AM

## 2022-03-01 NOTE — Discharge Summary (Signed)
Physician Discharge Summary  Stephanie Pearson [id number] DOB: Aug 02, 1927 DOA: 02/25/2022  PCP: Merry Lofty, NP-C  Admit date: 02/25/2022 Discharge date: 03/01/2022  Admitted From: Home Disposition: Home with home health  Recommendations for Outpatient Follow-up:  Follow up with PCP in 1-2 weeks Remove sutures 2/18.  Home Health: PT/OT Equipment/Devices: Available at home  Discharge Condition: Stable CODE STATUS: Full code Diet recommendation: Low-salt diet  Discharge summary:  89 year old with history of hypertension, DVT on Xarelto who had a mechanical fall at home and brought to ER where she was found to have a small subarachnoid hemorrhage, fracture of facial Tri-Port and wrist fracture.  Seen by neurosurgery recommended conservative management.  Seen by orthopedics Dr. Percell Miller and discharged on sling with plan to follow-up.  Patient had episode of bradycardia and hypotension on mobility that improved with 500 cc bolus fluid and decreasing dose of carvedilol.  Currently medically stable.  Subarachnoid hemorrhage: Discontinuing Xarelto for now.  Repeat CT head is stable.  Continue to hold Xarelto until follow-up. Close fracture of Tripod: Follow-up with ophthalmology as outpatient.  She has stable on the left supraorbital area, to be removed in 4 days. Essential hypertension: Blood pressure stable on amlodipine lisinopril and decrease dose of metoprolol. Left wrist fracture: Follow-up with Dr. Annabell Sabal. Constipation: Senokot and MiraLAX as needed.  Stable for discharge.    Discharge Diagnoses:  Principal Problem:   SAH (subarachnoid hemorrhage) (HCC) Active Problems:   Orthostatic hypotension   Closed fracture of tripod, initial encounter (HCC)   Wrist fracture, closed, left, initial encounter   Essential hypertension   Hyponatremia   History of DVT (deep vein thrombosis)   Fall at home, initial encounter   Subarachnoid hemorrhage following injury [facility])    Discharge  Instructions  Discharge Instructions     Diet - low sodium heart healthy   Complete by: As directed    Discharge wound care:   Complete by: As directed    Keep it dry , remove stitches left eye brow on 2/19   Increase activity slowly   Complete by: As directed       Allergies as of 03/01/2022       Reactions   Prilosec [omeprazole] Rash        Medication List     STOP taking these medications    Xarelto 20 MG Tabs tablet Generic drug: rivaroxaban       TAKE these medications    amLODipine 2.5 MG tablet Commonly known as: NORVASC Take 2.5 mg by mouth daily.   fosinopril 40 MG tablet Commonly known as: MONOPRIL Take 40 mg by mouth daily.   lovastatin 40 MG tablet Commonly known as: MEVACOR Take 40 mg by mouth at bedtime.   metoprolol tartrate 25 MG tablet Commonly known as: LOPRESSOR Take 0.5 tablets (12.5 mg total) by mouth 2 (two) times daily. What changed:  medication strength how much to take   pantoprazole 40 MG tablet Commonly known as: PROTONIX Take 40 mg by mouth daily.               Discharge Care Instructions  (From admission, onward)           Start     Ordered   03/01/22 0000  Discharge wound care:       Comments: Keep it dry , remove stitches left eye brow on 2/19   03/01/22 0902            Follow-up Information  Renette Butters, MD. Schedule an appointment as soon as possible for a visit in 1 week(s).   Specialty: Orthopedic Surgery Contact information: [address] [place] [address] [phone]         Lisabeth Pick, MD Follow up.   Specialty: Ophthalmology Why: For the eye bruise and facial fracture Contact information: [place] [address] [phone]                Allergies  Allergen Reactions   Prilosec [Omeprazole] Rash    Consultations: Neurosurgery Ophthalmology Orthopedics   Procedures/Studies: CT HEAD WO CONTRAST  (5MM)  Result Date: 02/27/2022 CLINICAL DATA:  Altered mental status, recent subarachnoid hemorrhage. EXAM: CT HEAD WITHOUT CONTRAST TECHNIQUE: Contiguous axial images were obtained from the base of the skull through the vertex without intravenous contrast. RADIATION DOSE REDUCTION: This exam was performed according to the departmental dose-optimization program which includes automated exposure control, adjustment of the mA and/or kV according to patient size and/or use of iterative reconstruction technique. COMPARISON:  CT head and facial bones 2 days prior. FINDINGS: Brain: Small volume subarachnoid hemorrhage overlying the inferolateral left frontal lobe is unchanged there is no new acute intracranial hemorrhage or extra-axial fluid collection. There is no evidence of acute territorial infarct. Parenchymal volume is stable. The ventricles are stable in size. Gray-white differentiation is normal. Patchy and confluent hypodensity in the supratentorial white matter likely reflecting sequela of chronic small-vessel ischemic change is stable. The pituitary and suprasellar region are normal. There is no mass lesion. There is no mass effect or midline shift. Vascular: There is calcification of the bilateral carotid siphons and vertebral arteries. Skull: Normal. Negative for fracture or focal lesion. Sinuses/Orbits: Blood layering in the left maxillary sinus is unchanged. A small amount of blood in the extraconal fat along the inferior left orbit is unchanged. Left larger than right mastoid effusions are unchanged unchanged. No definite temporal bone fracture is seen. There is no evidence of ossicular dislocation. Other: Fractures of the left lateral orbital wall and orbital floor, and posterior wall of the left maxillary sinus are again noted. These are described in detail on the CT facial bones from 2 days prior. Left facial/periorbital soft tissue swelling is stable to slightly decreased. IMPRESSION: 1. Stable small  volume subarachnoid hemorrhage overlying the left frontal lobe. No new acute intracranial pathology. 2. Facial bone fractures and left maxillary hemosinus are unchanged, described on the CT facial bones from 2 days prior. Electronically Signed   By: Valetta Mole M.D.   On: 02/27/2022 15:43   DG Wrist Complete Left  Result Date: 02/25/2022 CLINICAL DATA:  Fall EXAM: LEFT WRIST - COMPLETE 3+ VIEW COMPARISON:  None Available. FINDINGS: The bones are osteopenic. There is an acute transverse fracture through the distal radius, mildly impacted. There is 7 mm of posterior offset along the posterior aspect of the fracture. There is an acute nondisplaced fracture through the distal ulna which is nondisplaced. Nondisplaced ulnar styloid fracture is present. There are degenerative changes of the wrist with radiocarpal and ulnocarpal joint space narrowing and calcification of the triangular fibrocartilage. There is soft tissue swelling surrounding the wrist. IMPRESSION: 1. Acute transverse fracture through the distal radius. 2. Acute nondisplaced fracture through the distal ulna. 3. Nondisplaced ulnar styloid fracture. Electronically Signed   By: Ronney Asters M.D.   On: 02/25/2022 23:14   CT MAXILLOFACIAL WO CONTRAST  Result Date: 02/25/2022 CLINICAL DATA:  Initial evaluation for acute trauma, fall.  EXAM: CT MAXILLOFACIAL WITHOUT CONTRAST TECHNIQUE: Multidetector CT imaging of the maxillofacial structures was performed. Multiplanar CT image reconstructions were also generated. RADIATION DOSE REDUCTION: This exam was performed according to the departmental dose-optimization program which includes automated exposure control, adjustment of the mA and/or kV according to patient size and/or use of iterative reconstruction technique. COMPARISON:  None Available. FINDINGS: Osseous: Subtle lucency extends through the left cycle medic arch (series 6, image 58), somewhat age indeterminate, but presumably acute. There are acute  minimally displaced fractures extending through the posterior wall of the left maxillary sinus. Additional acute nondisplaced fracture extends through the anterior wall. Associated fractures of the left orbital floor with up to 2 mm of depression (series 9, image 31). Comminuted and mildly angulated fractures of the lateral left orbital wall noted as well. Left lamina per appreciate intact. No acute osseous abnormality about the contralateral right orbit. Nasal bones intact. Right-to-left nasal septal deviation without fracture. Mandible intact. Mandibular condyles normally situated. No acute abnormality about the dentition. Orbits: Fractures involving the left orbital floor and lateral left orbital wall as above. Small amount of hemorrhage present within the extraconal fat of the inferior left orbit. Globes intact. Prior ocular lens replacement. Sinuses: Hemosinus within the left maxillary sinus. Scattered mucosal thickening present within the sphenoid ethmoidal sinuses. Moderate left mastoid effusion, partially visualized. Soft tissues: Acute left periorbital and facial contusion. No frank hematoma. Limited intracranial: Small focus of acute hemorrhage noted at the anterior/inferior left frontal lobe (series 4, image 71). Finding better characterized on corresponding head CT. IMPRESSION: 1. Acute left facial tripod fracture as detailed above. 2. Small volume hemorrhage within the inferior bony left orbit. No retro-orbital hematoma. Globes intact. 3. Overlying acute left periorbital and facial contusion. 4. Small focus of acute hemorrhage at the anterior/inferior left frontal lobe, better characterized on corresponding head CT. Electronically Signed   By: Jeannine Boga M.D.   On: 02/25/2022 19:52   CT CHEST ABDOMEN PELVIS W CONTRAST  Result Date: 02/25/2022 CLINICAL DATA:  Polytrauma, blunt [id number] Trauma [id number] EXAM: CT CHEST, ABDOMEN, AND PELVIS WITH CONTRAST TECHNIQUE: Multidetector CT imaging of the  chest, abdomen and pelvis was performed following the standard protocol during bolus administration of intravenous contrast. RADIATION DOSE REDUCTION: This exam was performed according to the departmental dose-optimization program which includes automated exposure control, adjustment of the mA and/or kV according to patient size and/or use of iterative reconstruction technique. CONTRAST:  19m OMNIPAQUE IOHEXOL 350 MG/ML SOLN COMPARISON:  CT abdomen pelvis 02/16/2004 FINDINGS: CHEST: Cardiovascular: No aortic injury. The thoracic aorta is normal in caliber. The heart is normal in size. No significant pericardial effusion. Likely mitral annular calcifications. Aortic valve leaflet calcifications. Moderate atherosclerotic plaque of the aorta. Left anterior descending coronary calcification. The main pulmonary artery is normal in caliber. No central pulmonary embolus. Mediastinum/Nodes: No pneumomediastinum. No mediastinal hematoma. The esophagus is unremarkable.  Small hiatal hernia. Subcentimeter thyroid hypodense nodules. Not clinically significant; no follow-up imaging recommended (ref: J Am Coll Radiol. 2015 Feb;12(2): 143-50). The central airways are patent. No mediastinal, hilar, or axillary lymphadenopathy. Lungs/Pleura: No focal consolidation. No pulmonary nodule. No pulmonary mass. No pulmonary contusion or laceration. No pneumatocele formation. No pleural effusion. No pneumothorax. No hemothorax. Musculoskeletal/Chest wall: No chest wall mass.  Left mastectomy. Diffusely decreased bone density. No acute rib or sternal fracture. Old healed bilateral rib fractures. No spinal fracture. Rotator cuff anchor suture along the left humeral head. Severe degenerative changes of the bilateral shoulders. ABDOMEN / PELVIS: Hepatobiliary:  Not enlarged. No focal lesion. No laceration or subcapsular hematoma. Status post cholecystectomy.  Pneumobilia. Pancreas: Normal pancreatic contour. No main pancreatic duct dilatation.  Spleen: Not enlarged. No focal lesion. No laceration, subcapsular hematoma, or vascular injury. Adrenals/Urinary Tract: No nodularity bilaterally. Bilateral kidneys enhance symmetrically. No hydronephrosis. No contusion, laceration, or subcapsular hematoma. Subcentimeter hypodensities too small to characterize-no further follow-up indicated. No injury to the vascular structures or collecting systems. No hydroureter. The urinary bladder is unremarkable. Stomach/Bowel: No small or large bowel wall thickening or dilatation. Diffuse sigmoid diverticulosis. Otherwise scattered colonic diverticulosis. The appendix is not definitely identified with no inflammatory changes in the right lower quadrant to suggest acute appendicitis. Vasculature/Lymphatics: No abdominal aorta or iliac aneurysm. No active contrast extravasation or pseudoaneurysm. No abdominal, pelvic, inguinal lymphadenopathy. Reproductive: Normal. Other: No simple free fluid ascites. No pneumoperitoneum. No hemoperitoneum. No mesenteric hematoma identified. No organized fluid collection. Musculoskeletal: No significant soft tissue hematoma. Bilateral small fat containing inguinal hernias. Diffusely decreased bone density. No acute pelvic fracture. Old healed right hip fracture. No spinal fracture. Grade 2 anterolisthesis of L5 on S1. Grade 1 anterolisthesis of L4 on L5. Ports and Devices: None. IMPRESSION: 1. No acute traumatic injury to the chest, abdomen, or pelvis. 2. No acute fracture or traumatic malalignment of the thoracic or lumbar spine. 3. Other imaging findings of potential clinical significance: Colonic diverticulosis with no acute diverticulitis. Bilateral fat containing inguinal hernias. Aortic Atherosclerosis (ICD10-I70.0) including likely mitral annular, coronary artery, as well as aortic valve leaflet calcifications-correlate for aortic stenosis. Electronically Signed   By: Iven Finn M.D.   On: 02/25/2022 19:51   CT CERVICAL SPINE WO  CONTRAST  Result Date: 02/25/2022 CLINICAL DATA:  Unwitnessed fall EXAM: CT CERVICAL SPINE WITHOUT CONTRAST TECHNIQUE: Multidetector CT imaging of the cervical spine was performed without intravenous contrast. Multiplanar CT image reconstructions were also generated. RADIATION DOSE REDUCTION: This exam was performed according to the departmental dose-optimization program which includes automated exposure control, adjustment of the mA and/or kV according to patient size and/or use of iterative reconstruction technique. COMPARISON:  None Available. FINDINGS: Alignment: Normal. Skull base and vertebrae: Craniocervical alignment is normal. The atlantodental interval is not widened. No acute fracture of the cervical spine. No lytic or blastic bone lesion. Soft tissues and spinal canal: No prevertebral fluid or swelling. No visible canal hematoma. There is extensive atherosclerotic calcification within the carotid bifurcations bilaterally. Disc levels: There is intervertebral disc space narrowing and endplate remodeling throughout the cervical spine, most severe at C5-C7 in keeping with changes of mild to moderate degenerative disc disease. Prevertebral soft tissues are not thickened on sagittal reformats. Spinal canal is widely patent. Multilevel uncovertebral and facet arthrosis results in mild multilevel neuroforaminal narrowing, most severe at C5-C7. Upper chest: Negative. Other: None IMPRESSION: 1. No acute fracture or listhesis of the cervical spine. 2. Extensive atherosclerotic calcification within the carotid bifurcations bilaterally. If indicated, the degree of stenosis would be better assessed with dedicated carotid artery Doppler sonography. Electronically Signed   By: Fidela Salisbury M.D.   On: 02/25/2022 19:42   CT HEAD WO CONTRAST  Result Date: 02/25/2022 CLINICAL DATA:  Trauma EXAM: CT HEAD WITHOUT CONTRAST TECHNIQUE: Contiguous axial images were obtained from the base of the skull through the vertex  without intravenous contrast. RADIATION DOSE REDUCTION: This exam was performed according to the departmental dose-optimization program which includes automated exposure control, adjustment of the mA and/or kV according to patient size and/or use of iterative reconstruction technique. COMPARISON:  None Available. FINDINGS: Brain: There is a very small amount of subarachnoid hemorrhage in the anterior left frontal lobe image 4/16 and 4/20 there is no mass effect, midline shift or extra-axial fluid collection. There is no acute infarct. There is mild diffuse atrophy. There is moderate periventricular and deep white matter hypodensity, likely chronic small vessel ischemic change. Vascular: Atherosclerotic calcifications are present within the cavernous internal carotid arteries. Skull: Negative for skull fracture. Fractures of the left maxilla and left orbit noted. Please see dedicated CT maxillofacial for further description. Sinuses/Orbits: There is hemorrhage/air-fluid level in the left maxillary sinus. Bilateral mastoid effusions are present. Other: Left periorbital swelling. IMPRESSION: 1. Very small amount of subarachnoid hemorrhage in the anterior left frontal lobe. No mass effect or midline shift. 2. Atrophy and chronic small vessel ischemic changes. 3. Fractures of the left maxilla and left orbit. Please see dedicated CT maxillofacial for further description. Electronically Signed   By: Ronney Asters M.D.   On: 02/25/2022 19:39   DG Humerus Left  Result Date: 02/25/2022 CLINICAL DATA:  Trauma to the left upper extremity. EXAM: LEFT SHOULDER; LEFT HUMERUS - 2+ VIEW COMPARISON:  Chest radiograph dated 02/25/2022. FINDINGS: No acute fracture or dislocation of the left shoulder. The bones are osteopenic. There is degenerative changes with calcific tendinopathy of the supraspinatus muscle. Humeral head anchor pins noted. Multiple old appearing left rib fractures. Several surgical clips noted over the left  axilla. IMPRESSION: 1. No acute fracture or dislocation of the left shoulder or left humerus. 2. Osteopenia and degenerative changes. Electronically Signed   By: Anner Crete M.D.   On: 02/25/2022 18:52   DG Shoulder Left Port  Result Date: 02/25/2022 CLINICAL DATA:  Trauma to the left upper extremity. EXAM: LEFT SHOULDER; LEFT HUMERUS - 2+ VIEW COMPARISON:  Chest radiograph dated 02/25/2022. FINDINGS: No acute fracture or dislocation of the left shoulder. The bones are osteopenic. There is degenerative changes with calcific tendinopathy of the supraspinatus muscle. Humeral head anchor pins noted. Multiple old appearing left rib fractures. Several surgical clips noted over the left axilla. IMPRESSION: 1. No acute fracture or dislocation of the left shoulder or left humerus. 2. Osteopenia and degenerative changes. Electronically Signed   By: Anner Crete M.D.   On: 02/25/2022 18:52   DG Chest Port 1 View  Result Date: 02/25/2022 CLINICAL DATA:  Trauma EXAM: PORTABLE CHEST 1 VIEW COMPARISON:  12/20/2021, 12/13/2021 FINDINGS: Heart size is stable. No focal airspace consolidation, pleural effusion, or pneumothorax. Multiple chronic bilateral rib fractures. No definite acute rib fracture is seen. IMPRESSION: 1. No acute findings within the chest. 2. Multiple chronic bilateral rib fractures. No definite acute rib fracture is seen. Electronically Signed   By: Davina Poke D.O.   On: 02/25/2022 18:36   DG Pelvis Portable  Result Date: 02/25/2022 CLINICAL DATA:  Trauma. EXAM: PORTABLE PELVIS 1-2 VIEWS COMPARISON:  None Available. FINDINGS: No fracture.  No bone lesion. Hip joints, SI joints and pubic symphysis are normally aligned. Skeletal structures are demineralized. Soft tissues are unremarkable. IMPRESSION: No fracture or acute finding. Electronically Signed   By: Lajean Manes M.D.   On: 02/25/2022 18:36   (Echo, Carotid, EGD, Colonoscopy, ERCP)    Subjective: Patient seen in the morning  rounds.  Daughter was at the bedside.  Patient denied any complaints.  She had walked multiple times yesterday denies any dizziness or lightheadedness.  Pain is controlled with Tylenol.  Daughter requested me to put her on stool softener. Lives at  home with her daughter and son-in-law, eager to go home.   Discharge Exam: Vitals:   03/01/22 0436 03/01/22 0754  BP: 123/79 (!) 137/55  Pulse: 67 64  Resp: 16 20  Temp: 98.1 F (36.7 C) 97.6 F (36.4 C)  SpO2: 97% 99%   Vitals:   02/28/22 1944 02/28/22 2338 03/01/22 0436 03/01/22 0754  BP: (!) 131/54 114/75 123/79 (!) 137/55  Pulse: 74 70 67 64  Resp: 15 17 16 20  $ Temp: 98 F (36.7 C) 98 F (36.7 C) 98.1 F (36.7 C) 97.6 F (36.4 C)  TempSrc: Oral Oral Oral Oral  SpO2: 100% 96% 97% 99%  Weight:      Height:        General: Pt is alert, awake, not in acute distress Hard of hearing. She has ecchymosis on the left periorbital area.  She has a silk suture on the left eyebrow.  Minimal subconjunctival hemorrhage. Cardiovascular: RRR, S1/S2 +, no rubs, no gallops Respiratory: CTA bilaterally, no wheezing, no rhonchi Abdominal: Soft, NT, ND, bowel sounds + Extremities: no edema, no cyanosis Left wrist on splint and sling.  Distal neurovascular status intact.    The results of significant diagnostics from this hospitalization (including imaging, microbiology, ancillary and laboratory) are listed below for reference.     Microbiology: No results found for this or any previous visit (from the past 240 hour(s)).   Labs: BNP (last 3 results) No results for input(s): "BNP" in the last 8760 hours. Basic Metabolic Panel: Recent Labs  Lab 02/25/22 1817 02/25/22 1832 02/26/22 0141  NA 128* 129* 131*  K 4.2 4.1 3.4*  CL 93* 94* 99  CO2 24  --  21*  GLUCOSE 126* 124* 111*  BUN 12 14 10  $ CREATININE 0.87 0.70 0.84  CALCIUM 9.0  --  8.4*   Liver Function Tests: Recent Labs  Lab 02/25/22 1817  AST 39  ALT 16  ALKPHOS 72   BILITOT 0.9  PROT 6.9  ALBUMIN 3.5   No results for input(s): "LIPASE", "AMYLASE" in the last 168 hours. No results for input(s): "AMMONIA" in the last 168 hours. CBC: Recent Labs  Lab 02/25/22 1817 02/25/22 1832 02/26/22 0141  WBC 15.4*  --  10.2  HGB 12.9 14.3 10.6*  HCT 40.2 42.0 31.6*  MCV 87.8  --  85.4  PLT 193  --  179   Cardiac Enzymes: Recent Labs  Lab 02/25/22 1817  CKTOTAL 227   BNP: Invalid input(s): "POCBNP" CBG: No results for input(s): "GLUCAP" in the last 168 hours. D-Dimer No results for input(s): "DDIMER" in the last 72 hours. Hgb A1c No results for input(s): "HGBA1C" in the last 72 hours. Lipid Profile No results for input(s): "CHOL", "HDL", "LDLCALC", "TRIG", "CHOLHDL", "LDLDIRECT" in the last 72 hours. Thyroid function studies No results for input(s): "TSH", "T4TOTAL", "T3FREE", "THYROIDAB" in the last 72 hours.  Invalid input(s): "FREET3" Anemia work up No results for input(s): "VITAMINB12", "FOLATE", "FERRITIN", "TIBC", "IRON", "RETICCTPCT" in the last 72 hours. Urinalysis    Component Value Date/Time   COLORURINE STRAW (A) 02/25/2022 2120   APPEARANCEUR CLEAR 02/25/2022 2120   LABSPEC 1.023 02/25/2022 2120   PHURINE 7.0 02/25/2022 2120   GLUCOSEU NEGATIVE 02/25/2022 2120   HGBUR SMALL (A) 02/25/2022 2120   BILIRUBINUR NEGATIVE 02/25/2022 2120   KETONESUR 5 (A) 02/25/2022 2120   PROTEINUR NEGATIVE 02/25/2022 2120   NITRITE NEGATIVE 02/25/2022 2120   LEUKOCYTESUR NEGATIVE 02/25/2022 2120   Sepsis Labs Recent Labs  Lab  02/25/22 1817 02/26/22 0141  WBC 15.4* 10.2   Microbiology No results found for this or any previous visit (from the past 240 hour(s)).   Time coordinating discharge: 32 minutes  SIGNED:   Barb Merino, MD  [facility] 03/01/2022, 9:02 AM

## 2022-03-01 NOTE — TOC Transition Note (Signed)
Transition of Care (TOC) - CM/SW Discharge Note [name] RN, BSN Transitions of Care Unit 4E- RN Case Manager See Treatment Team for direct phone #   Patient Details  Name: Stephanie Pearson MRN: [id number] Date of Birth: [birth year]  Transition of Care [facility]) CM/SW Contact:  Dawayne Patricia, RN Phone Number: 03/01/2022, 2:10 PM   Clinical Narrative:    Pt stable for transition home today, daughter and SIL to transport home, Pt has needed DME as per conversation with daughter previously.   Orders have been placed for HHPT/OT- per daughter they have used [facility] recently and would like to use them again.   Call made to [facility]- spoke with Ivin Booty- who confirmed they can accept new referral for HHPT/OT- they will contact daughter to schedule new start of care visit.   No further TOC needs noted at this time   Final next level of care: [place] Barriers to Discharge: Barriers Resolved   Patient Goals and CMS Choice CMS Medicare.gov Compare Post Acute Care list provided to:: Patient Choice offered to / list presented to : Adult Children  Discharge Placement                 Home w/ [facility]        Discharge Plan and Services Additional resources added to the After Visit Summary for     Discharge Planning Services: CM Consult Post Acute Care Choice: Home Health, Resumption of Svcs/PTA Provider          DME Arranged: N/A DME Agency: NA       HH Arranged: PT, OT HH Agency: [place] Date [place]: 03/01/22 Time [place]: 1030 Representative spoke with at [place]: [place] Determinants of Health ([place]) Interventions SDOH Screenings   Food Insecurity: No Food Insecurity (02/28/2022)  Housing: Low Risk  (02/28/2022)  Transportation Needs: No Transportation Needs (02/28/2022)  Utilities: Not At Risk (02/28/2022)  Tobacco Use: Low Risk  (02/28/2022)     Readmission Risk Interventions     03/01/2022    2:10 PM  Readmission Risk Prevention Plan  Post Dischage Appt Complete  Medication Screening Complete  Transportation Screening Complete

## 2022-03-03 DIAGNOSIS — Z9012 Acquired absence of left breast and nipple: Secondary | ICD-10-CM | POA: Diagnosis not present

## 2022-03-03 DIAGNOSIS — E871 Hypo-osmolality and hyponatremia: Secondary | ICD-10-CM | POA: Diagnosis not present

## 2022-03-03 DIAGNOSIS — B354 Tinea corporis: Secondary | ICD-10-CM | POA: Diagnosis not present

## 2022-03-03 DIAGNOSIS — B379 Candidiasis, unspecified: Secondary | ICD-10-CM | POA: Diagnosis not present

## 2022-03-03 DIAGNOSIS — Z9181 History of falling: Secondary | ICD-10-CM | POA: Diagnosis not present

## 2022-03-03 DIAGNOSIS — E782 Mixed hyperlipidemia: Secondary | ICD-10-CM | POA: Diagnosis not present

## 2022-03-03 DIAGNOSIS — S066X0D Traumatic subarachnoid hemorrhage without loss of consciousness, subsequent encounter: Secondary | ICD-10-CM | POA: Diagnosis not present

## 2022-03-03 DIAGNOSIS — I129 Hypertensive chronic kidney disease with stage 1 through stage 4 chronic kidney disease, or unspecified chronic kidney disease: Secondary | ICD-10-CM | POA: Diagnosis not present

## 2022-03-03 DIAGNOSIS — Z853 Personal history of malignant neoplasm of breast: Secondary | ICD-10-CM | POA: Diagnosis not present

## 2022-03-03 DIAGNOSIS — J449 Chronic obstructive pulmonary disease, unspecified: Secondary | ICD-10-CM | POA: Diagnosis not present

## 2022-03-03 DIAGNOSIS — M19032 Primary osteoarthritis, left wrist: Secondary | ICD-10-CM | POA: Diagnosis not present

## 2022-03-03 DIAGNOSIS — N1831 Chronic kidney disease, stage 3a: Secondary | ICD-10-CM | POA: Diagnosis not present

## 2022-03-03 DIAGNOSIS — M85832 Other specified disorders of bone density and structure, left forearm: Secondary | ICD-10-CM | POA: Diagnosis not present

## 2022-03-03 DIAGNOSIS — K219 Gastro-esophageal reflux disease without esophagitis: Secondary | ICD-10-CM | POA: Diagnosis not present

## 2022-03-03 DIAGNOSIS — G4709 Other insomnia: Secondary | ICD-10-CM | POA: Diagnosis not present

## 2022-03-03 DIAGNOSIS — J439 Emphysema, unspecified: Secondary | ICD-10-CM | POA: Diagnosis not present

## 2022-03-03 DIAGNOSIS — M800AXD Age-related osteoporosis with current pathological fracture, other site, subsequent encounter for fracture with routine healing: Secondary | ICD-10-CM | POA: Diagnosis not present

## 2022-03-03 DIAGNOSIS — I951 Orthostatic hypotension: Secondary | ICD-10-CM | POA: Diagnosis not present

## 2022-03-03 DIAGNOSIS — Z86718 Personal history of other venous thrombosis and embolism: Secondary | ICD-10-CM | POA: Diagnosis not present

## 2022-03-03 DIAGNOSIS — I700 Atherosclerosis of aorta: Secondary | ICD-10-CM | POA: Diagnosis not present

## 2022-03-03 DIAGNOSIS — Z556 Problems related to health literacy: Secondary | ICD-10-CM | POA: Diagnosis not present

## 2022-03-03 DIAGNOSIS — M80032D Age-related osteoporosis with current pathological fracture, left forearm, subsequent encounter for fracture with routine healing: Secondary | ICD-10-CM | POA: Diagnosis not present

## 2022-03-03 DIAGNOSIS — I609 Nontraumatic subarachnoid hemorrhage, unspecified: Secondary | ICD-10-CM | POA: Diagnosis not present

## 2022-03-07 DIAGNOSIS — G4709 Other insomnia: Secondary | ICD-10-CM | POA: Diagnosis not present

## 2022-03-07 DIAGNOSIS — I609 Nontraumatic subarachnoid hemorrhage, unspecified: Secondary | ICD-10-CM | POA: Diagnosis not present

## 2022-03-07 DIAGNOSIS — S01112D Laceration without foreign body of left eyelid and periocular area, subsequent encounter: Secondary | ICD-10-CM | POA: Diagnosis not present

## 2022-03-07 DIAGNOSIS — K5900 Constipation, unspecified: Secondary | ICD-10-CM | POA: Diagnosis not present

## 2022-03-07 DIAGNOSIS — S62102D Fracture of unspecified carpal bone, left wrist, subsequent encounter for fracture with routine healing: Secondary | ICD-10-CM | POA: Diagnosis not present

## 2022-03-10 DIAGNOSIS — S52502D Unspecified fracture of the lower end of left radius, subsequent encounter for closed fracture with routine healing: Secondary | ICD-10-CM | POA: Diagnosis not present

## 2022-03-11 DIAGNOSIS — M79641 Pain in right hand: Secondary | ICD-10-CM | POA: Diagnosis not present

## 2022-03-16 DIAGNOSIS — H524 Presbyopia: Secondary | ICD-10-CM | POA: Diagnosis not present

## 2022-03-16 DIAGNOSIS — S0232XA Fracture of orbital floor, left side, initial encounter for closed fracture: Secondary | ICD-10-CM | POA: Diagnosis not present

## 2022-03-24 DIAGNOSIS — R350 Frequency of micturition: Secondary | ICD-10-CM | POA: Diagnosis not present

## 2022-03-24 DIAGNOSIS — R609 Edema, unspecified: Secondary | ICD-10-CM | POA: Diagnosis not present

## 2022-03-28 ENCOUNTER — Ambulatory Visit (INDEPENDENT_AMBULATORY_CARE_PROVIDER_SITE_OTHER): Payer: PPO

## 2022-03-28 ENCOUNTER — Ambulatory Visit: Payer: PPO | Admitting: Podiatry

## 2022-03-28 DIAGNOSIS — M79675 Pain in left toe(s): Secondary | ICD-10-CM | POA: Diagnosis not present

## 2022-03-28 DIAGNOSIS — B351 Tinea unguium: Secondary | ICD-10-CM | POA: Diagnosis not present

## 2022-03-28 DIAGNOSIS — M722 Plantar fascial fibromatosis: Secondary | ICD-10-CM

## 2022-03-28 DIAGNOSIS — M79674 Pain in right toe(s): Secondary | ICD-10-CM

## 2022-03-28 NOTE — Progress Notes (Signed)
  Subjective:  Patient ID: Stephanie Pearson, female    DOB: 1927/12/11,  MRN: [id number]  Chief Complaint  Patient presents with   Nail Problem    Routine Foot Care    Foot Pain    Bilateral heel pain. Patient has a history of plantar fasciitis. Patient does not ambulate frequently and she needs assistance with ambulation. Patient daughter concern of swelling, patient does not wear any compression socks.     89 y.o. female presents with the above complaint. History confirmed with patient. Patient presenting with pain related to dystrophic thickened elongated nails. Patient is unable to trim own nails related to nail dystrophy and/or mobility issues. Patient does not have a history of T2DM. Patient does report pain in both feet in the heels. Also concerned with edema bilateral LE. She is very limited in her mobility.  Objective:  Physical Exam: warm, good capillary refill nail exam onychomycosis of the toenails, onycholysis, and dystrophic nails DP pulses palpable, PT pulses palpable, and protective sensation intact Left Foot:  Pain with palpation of nails due to elongation and dystrophic growth.  Right Foot: Pain with palpation of nails due to elongation and dystrophic growth.   XR deferred  Assessment:   1. Pain due to onychomycosis of toenails of both feet   2. Plantar fasciitis, bilateral      Plan:  Patient was evaluated and treated and all questions answered.  Plantar Fasciitis, bilaterally -Recommend continued conservative care, icing tylenol PRN  #Bilateral LE edema -Recommend compression stockings - Pt daughter states will pick up today  #Onychomycosis with pain  -Nails palliatively debrided as below. -Educated on self-care  Procedure: Nail Debridement Rationale: Pain Type of Debridement: manual, sharp debridement. Instrumentation: Nail nipper, rotary burr. Number of Nails: 10  Return in about 9 weeks (around 05/30/2022) for RFC.         Everitt Amber,  DPM [facility]

## 2022-04-02 DIAGNOSIS — B379 Candidiasis, unspecified: Secondary | ICD-10-CM | POA: Diagnosis not present

## 2022-04-02 DIAGNOSIS — J439 Emphysema, unspecified: Secondary | ICD-10-CM | POA: Diagnosis not present

## 2022-04-02 DIAGNOSIS — I129 Hypertensive chronic kidney disease with stage 1 through stage 4 chronic kidney disease, or unspecified chronic kidney disease: Secondary | ICD-10-CM | POA: Diagnosis not present

## 2022-04-02 DIAGNOSIS — Z9181 History of falling: Secondary | ICD-10-CM | POA: Diagnosis not present

## 2022-04-02 DIAGNOSIS — J449 Chronic obstructive pulmonary disease, unspecified: Secondary | ICD-10-CM | POA: Diagnosis not present

## 2022-04-02 DIAGNOSIS — B354 Tinea corporis: Secondary | ICD-10-CM | POA: Diagnosis not present

## 2022-04-02 DIAGNOSIS — M19032 Primary osteoarthritis, left wrist: Secondary | ICD-10-CM | POA: Diagnosis not present

## 2022-04-02 DIAGNOSIS — E782 Mixed hyperlipidemia: Secondary | ICD-10-CM | POA: Diagnosis not present

## 2022-04-02 DIAGNOSIS — Z853 Personal history of malignant neoplasm of breast: Secondary | ICD-10-CM | POA: Diagnosis not present

## 2022-04-02 DIAGNOSIS — M80032D Age-related osteoporosis with current pathological fracture, left forearm, subsequent encounter for fracture with routine healing: Secondary | ICD-10-CM | POA: Diagnosis not present

## 2022-04-02 DIAGNOSIS — E871 Hypo-osmolality and hyponatremia: Secondary | ICD-10-CM | POA: Diagnosis not present

## 2022-04-02 DIAGNOSIS — I951 Orthostatic hypotension: Secondary | ICD-10-CM | POA: Diagnosis not present

## 2022-04-02 DIAGNOSIS — I700 Atherosclerosis of aorta: Secondary | ICD-10-CM | POA: Diagnosis not present

## 2022-04-02 DIAGNOSIS — N1831 Chronic kidney disease, stage 3a: Secondary | ICD-10-CM | POA: Diagnosis not present

## 2022-04-02 DIAGNOSIS — M800AXD Age-related osteoporosis with current pathological fracture, other site, subsequent encounter for fracture with routine healing: Secondary | ICD-10-CM | POA: Diagnosis not present

## 2022-04-02 DIAGNOSIS — Z86718 Personal history of other venous thrombosis and embolism: Secondary | ICD-10-CM | POA: Diagnosis not present

## 2022-04-02 DIAGNOSIS — K219 Gastro-esophageal reflux disease without esophagitis: Secondary | ICD-10-CM | POA: Diagnosis not present

## 2022-04-02 DIAGNOSIS — G4709 Other insomnia: Secondary | ICD-10-CM | POA: Diagnosis not present

## 2022-04-02 DIAGNOSIS — Z556 Problems related to health literacy: Secondary | ICD-10-CM | POA: Diagnosis not present

## 2022-04-02 DIAGNOSIS — S066X0D Traumatic subarachnoid hemorrhage without loss of consciousness, subsequent encounter: Secondary | ICD-10-CM | POA: Diagnosis not present

## 2022-04-02 DIAGNOSIS — M85832 Other specified disorders of bone density and structure, left forearm: Secondary | ICD-10-CM | POA: Diagnosis not present

## 2022-04-02 DIAGNOSIS — Z9012 Acquired absence of left breast and nipple: Secondary | ICD-10-CM | POA: Diagnosis not present

## 2022-04-04 DIAGNOSIS — F5105 Insomnia due to other mental disorder: Secondary | ICD-10-CM | POA: Diagnosis not present

## 2022-04-04 DIAGNOSIS — R32 Unspecified urinary incontinence: Secondary | ICD-10-CM | POA: Diagnosis not present

## 2022-04-04 DIAGNOSIS — I872 Venous insufficiency (chronic) (peripheral): Secondary | ICD-10-CM | POA: Diagnosis not present

## 2022-04-04 DIAGNOSIS — S62102D Fracture of unspecified carpal bone, left wrist, subsequent encounter for fracture with routine healing: Secondary | ICD-10-CM | POA: Diagnosis not present

## 2022-04-04 DIAGNOSIS — I129 Hypertensive chronic kidney disease with stage 1 through stage 4 chronic kidney disease, or unspecified chronic kidney disease: Secondary | ICD-10-CM | POA: Diagnosis not present

## 2022-04-04 DIAGNOSIS — M79645 Pain in left finger(s): Secondary | ICD-10-CM | POA: Diagnosis not present

## 2022-04-04 DIAGNOSIS — M6281 Muscle weakness (generalized): Secondary | ICD-10-CM | POA: Diagnosis not present

## 2022-04-04 DIAGNOSIS — N1831 Chronic kidney disease, stage 3a: Secondary | ICD-10-CM | POA: Diagnosis not present

## 2022-04-04 DIAGNOSIS — S01112D Laceration without foreign body of left eyelid and periocular area, subsequent encounter: Secondary | ICD-10-CM | POA: Diagnosis not present

## 2022-04-04 DIAGNOSIS — I10 Essential (primary) hypertension: Secondary | ICD-10-CM | POA: Diagnosis not present

## 2022-04-04 DIAGNOSIS — L98411 Non-pressure chronic ulcer of buttock limited to breakdown of skin: Secondary | ICD-10-CM | POA: Diagnosis not present

## 2022-04-04 DIAGNOSIS — K219 Gastro-esophageal reflux disease without esophagitis: Secondary | ICD-10-CM | POA: Diagnosis not present

## 2022-04-04 DIAGNOSIS — K5900 Constipation, unspecified: Secondary | ICD-10-CM | POA: Diagnosis not present

## 2022-04-12 DIAGNOSIS — S52502D Unspecified fracture of the lower end of left radius, subsequent encounter for closed fracture with routine healing: Secondary | ICD-10-CM | POA: Diagnosis not present

## 2022-04-14 DIAGNOSIS — Z736 Limitation of activities due to disability: Secondary | ICD-10-CM | POA: Diagnosis not present

## 2022-04-14 DIAGNOSIS — R482 Apraxia: Secondary | ICD-10-CM | POA: Diagnosis not present

## 2022-04-14 DIAGNOSIS — M6259 Muscle wasting and atrophy, not elsewhere classified, multiple sites: Secondary | ICD-10-CM | POA: Diagnosis not present

## 2022-04-14 DIAGNOSIS — I69918 Other symptoms and signs involving cognitive functions following unspecified cerebrovascular disease: Secondary | ICD-10-CM | POA: Diagnosis not present

## 2022-04-14 DIAGNOSIS — K219 Gastro-esophageal reflux disease without esophagitis: Secondary | ICD-10-CM | POA: Diagnosis not present

## 2022-04-14 DIAGNOSIS — I10 Essential (primary) hypertension: Secondary | ICD-10-CM | POA: Diagnosis not present

## 2022-04-14 DIAGNOSIS — F39 Unspecified mood [affective] disorder: Secondary | ICD-10-CM | POA: Diagnosis not present

## 2022-04-14 DIAGNOSIS — R41841 Cognitive communication deficit: Secondary | ICD-10-CM | POA: Diagnosis not present

## 2022-04-14 DIAGNOSIS — R2681 Unsteadiness on feet: Secondary | ICD-10-CM | POA: Diagnosis not present

## 2022-04-14 DIAGNOSIS — R262 Difficulty in walking, not elsewhere classified: Secondary | ICD-10-CM | POA: Diagnosis not present

## 2022-04-14 DIAGNOSIS — G4700 Insomnia, unspecified: Secondary | ICD-10-CM | POA: Diagnosis not present

## 2022-04-14 DIAGNOSIS — N1831 Chronic kidney disease, stage 3a: Secondary | ICD-10-CM | POA: Diagnosis not present

## 2022-04-14 DIAGNOSIS — M800AXD Age-related osteoporosis with current pathological fracture, other site, subsequent encounter for fracture with routine healing: Secondary | ICD-10-CM | POA: Diagnosis not present

## 2022-04-14 DIAGNOSIS — R296 Repeated falls: Secondary | ICD-10-CM | POA: Diagnosis not present

## 2022-04-17 DIAGNOSIS — R262 Difficulty in walking, not elsewhere classified: Secondary | ICD-10-CM | POA: Diagnosis not present

## 2022-04-17 DIAGNOSIS — R482 Apraxia: Secondary | ICD-10-CM | POA: Diagnosis not present

## 2022-04-17 DIAGNOSIS — N1831 Chronic kidney disease, stage 3a: Secondary | ICD-10-CM | POA: Diagnosis not present

## 2022-04-17 DIAGNOSIS — I10 Essential (primary) hypertension: Secondary | ICD-10-CM | POA: Diagnosis not present

## 2022-04-24 DIAGNOSIS — R482 Apraxia: Secondary | ICD-10-CM | POA: Diagnosis not present

## 2022-04-24 DIAGNOSIS — N1831 Chronic kidney disease, stage 3a: Secondary | ICD-10-CM | POA: Diagnosis not present

## 2022-04-24 DIAGNOSIS — R262 Difficulty in walking, not elsewhere classified: Secondary | ICD-10-CM | POA: Diagnosis not present

## 2022-04-24 DIAGNOSIS — I10 Essential (primary) hypertension: Secondary | ICD-10-CM | POA: Diagnosis not present

## 2022-05-18 DIAGNOSIS — Z79899 Other long term (current) drug therapy: Secondary | ICD-10-CM | POA: Diagnosis not present

## 2022-05-25 DIAGNOSIS — F39 Unspecified mood [affective] disorder: Secondary | ICD-10-CM | POA: Diagnosis not present

## 2022-05-25 DIAGNOSIS — G4700 Insomnia, unspecified: Secondary | ICD-10-CM | POA: Diagnosis not present

## 2022-05-26 DIAGNOSIS — I10 Essential (primary) hypertension: Secondary | ICD-10-CM | POA: Diagnosis not present

## 2022-05-26 DIAGNOSIS — N1831 Chronic kidney disease, stage 3a: Secondary | ICD-10-CM | POA: Diagnosis not present

## 2022-05-26 DIAGNOSIS — K219 Gastro-esophageal reflux disease without esophagitis: Secondary | ICD-10-CM | POA: Diagnosis not present

## 2022-05-30 ENCOUNTER — Ambulatory Visit: Payer: PPO | Admitting: Podiatry

## 2022-05-30 DIAGNOSIS — B351 Tinea unguium: Secondary | ICD-10-CM

## 2022-05-30 DIAGNOSIS — M722 Plantar fascial fibromatosis: Secondary | ICD-10-CM

## 2022-05-30 DIAGNOSIS — M79675 Pain in left toe(s): Secondary | ICD-10-CM | POA: Diagnosis not present

## 2022-05-30 DIAGNOSIS — M79674 Pain in right toe(s): Secondary | ICD-10-CM

## 2022-05-30 NOTE — Progress Notes (Signed)
  Subjective:  Patient ID: Stephanie Pearson, female    DOB: [birth year],  MRN: [id number]  Chief Complaint  Patient presents with   routine foot care     89 y.o. female presents with the above complaint. History confirmed with patient. Patient presenting with pain related to dystrophic thickened elongated nails. Patient is unable to trim own nails related to nail dystrophy and/or mobility issues. Patient does not have a history of T2DM.   Objective:  Physical Exam: warm, good capillary refill nail exam onychomycosis of the toenails, onycholysis, and dystrophic nails DP pulses palpable, PT pulses palpable, and protective sensation intact Left Foot:  Pain with palpation of nails due to elongation and dystrophic growth.  Right Foot: Pain with palpation of nails due to elongation and dystrophic growth.   XR deferred  Assessment:   1. Pain due to onychomycosis of toenails of both feet   2. Plantar fasciitis, bilateral       Plan:  Patient was evaluated and treated and all questions answered.   #Onychomycosis with pain  -Nails palliatively debrided as below. -Educated on self-care  Procedure: Nail Debridement Rationale: Pain Type of Debridement: manual, sharp debridement. Instrumentation: Nail nipper, rotary burr. Number of Nails: 10  Return in about 3 months (around 08/30/2022) for RFC.         Corinna Gab, DPM [facility] / [facility]

## 2022-06-20 DIAGNOSIS — H9193 Unspecified hearing loss, bilateral: Secondary | ICD-10-CM | POA: Diagnosis not present

## 2022-06-20 DIAGNOSIS — H6123 Impacted cerumen, bilateral: Secondary | ICD-10-CM | POA: Diagnosis not present

## 2022-06-22 DIAGNOSIS — F39 Unspecified mood [affective] disorder: Secondary | ICD-10-CM | POA: Diagnosis not present

## 2022-06-22 DIAGNOSIS — G4700 Insomnia, unspecified: Secondary | ICD-10-CM | POA: Diagnosis not present

## 2022-06-29 DIAGNOSIS — E441 Mild protein-calorie malnutrition: Secondary | ICD-10-CM | POA: Diagnosis not present

## 2022-06-29 DIAGNOSIS — M818 Other osteoporosis without current pathological fracture: Secondary | ICD-10-CM | POA: Diagnosis not present

## 2022-06-29 DIAGNOSIS — H9193 Unspecified hearing loss, bilateral: Secondary | ICD-10-CM | POA: Diagnosis not present

## 2022-06-29 DIAGNOSIS — I10 Essential (primary) hypertension: Secondary | ICD-10-CM | POA: Diagnosis not present

## 2022-06-29 DIAGNOSIS — K5904 Chronic idiopathic constipation: Secondary | ICD-10-CM | POA: Diagnosis not present

## 2022-06-30 DIAGNOSIS — E559 Vitamin D deficiency, unspecified: Secondary | ICD-10-CM | POA: Diagnosis not present

## 2022-06-30 DIAGNOSIS — R4182 Altered mental status, unspecified: Secondary | ICD-10-CM | POA: Diagnosis not present

## 2022-08-03 DIAGNOSIS — N1831 Chronic kidney disease, stage 3a: Secondary | ICD-10-CM | POA: Diagnosis not present

## 2022-08-03 DIAGNOSIS — E441 Mild protein-calorie malnutrition: Secondary | ICD-10-CM | POA: Diagnosis not present

## 2022-08-03 DIAGNOSIS — I10 Essential (primary) hypertension: Secondary | ICD-10-CM | POA: Diagnosis not present

## 2022-08-03 DIAGNOSIS — R011 Cardiac murmur, unspecified: Secondary | ICD-10-CM | POA: Diagnosis not present

## 2022-08-10 DIAGNOSIS — G4700 Insomnia, unspecified: Secondary | ICD-10-CM | POA: Diagnosis not present

## 2022-08-10 DIAGNOSIS — F39 Unspecified mood [affective] disorder: Secondary | ICD-10-CM | POA: Diagnosis not present

## 2022-08-31 DIAGNOSIS — I10 Essential (primary) hypertension: Secondary | ICD-10-CM | POA: Diagnosis not present

## 2022-08-31 DIAGNOSIS — R609 Edema, unspecified: Secondary | ICD-10-CM | POA: Diagnosis not present

## 2022-08-31 DIAGNOSIS — L602 Onychogryphosis: Secondary | ICD-10-CM | POA: Diagnosis not present

## 2022-08-31 DIAGNOSIS — H6123 Impacted cerumen, bilateral: Secondary | ICD-10-CM | POA: Diagnosis not present

## 2022-09-07 ENCOUNTER — Ambulatory Visit: Payer: PPO | Admitting: Podiatry

## 2022-09-07 DIAGNOSIS — G3184 Mild cognitive impairment, so stated: Secondary | ICD-10-CM | POA: Diagnosis not present

## 2022-09-07 DIAGNOSIS — F411 Generalized anxiety disorder: Secondary | ICD-10-CM | POA: Diagnosis not present

## 2022-09-07 DIAGNOSIS — G4700 Insomnia, unspecified: Secondary | ICD-10-CM | POA: Diagnosis not present

## 2022-09-11 DIAGNOSIS — I10 Essential (primary) hypertension: Secondary | ICD-10-CM | POA: Diagnosis not present

## 2022-09-11 DIAGNOSIS — K219 Gastro-esophageal reflux disease without esophagitis: Secondary | ICD-10-CM | POA: Diagnosis not present

## 2022-09-11 DIAGNOSIS — N1831 Chronic kidney disease, stage 3a: Secondary | ICD-10-CM | POA: Diagnosis not present

## 2022-09-11 DIAGNOSIS — E441 Mild protein-calorie malnutrition: Secondary | ICD-10-CM | POA: Diagnosis not present

## 2022-10-04 DIAGNOSIS — R4586 Emotional lability: Secondary | ICD-10-CM | POA: Diagnosis not present

## 2022-10-04 DIAGNOSIS — K5904 Chronic idiopathic constipation: Secondary | ICD-10-CM | POA: Diagnosis not present

## 2022-10-04 DIAGNOSIS — L89321 Pressure ulcer of left buttock, stage 1: Secondary | ICD-10-CM | POA: Diagnosis not present

## 2022-10-05 DIAGNOSIS — N390 Urinary tract infection, site not specified: Secondary | ICD-10-CM | POA: Diagnosis not present

## 2022-10-05 DIAGNOSIS — G3184 Mild cognitive impairment, so stated: Secondary | ICD-10-CM | POA: Diagnosis not present

## 2022-10-05 DIAGNOSIS — F331 Major depressive disorder, recurrent, moderate: Secondary | ICD-10-CM | POA: Diagnosis not present

## 2022-10-05 DIAGNOSIS — N1831 Chronic kidney disease, stage 3a: Secondary | ICD-10-CM | POA: Diagnosis not present

## 2022-10-05 DIAGNOSIS — G4700 Insomnia, unspecified: Secondary | ICD-10-CM | POA: Diagnosis not present

## 2022-10-10 ENCOUNTER — Ambulatory Visit: Payer: PPO | Admitting: Podiatry

## 2022-10-12 DIAGNOSIS — K219 Gastro-esophageal reflux disease without esophagitis: Secondary | ICD-10-CM | POA: Diagnosis not present

## 2022-10-12 DIAGNOSIS — H6123 Impacted cerumen, bilateral: Secondary | ICD-10-CM | POA: Diagnosis not present

## 2022-10-12 DIAGNOSIS — F339 Major depressive disorder, recurrent, unspecified: Secondary | ICD-10-CM | POA: Diagnosis not present

## 2022-10-12 DIAGNOSIS — N390 Urinary tract infection, site not specified: Secondary | ICD-10-CM | POA: Diagnosis not present

## 2022-10-30 DIAGNOSIS — M1990 Unspecified osteoarthritis, unspecified site: Secondary | ICD-10-CM | POA: Diagnosis not present

## 2022-11-02 DIAGNOSIS — G3184 Mild cognitive impairment, so stated: Secondary | ICD-10-CM | POA: Diagnosis not present

## 2022-11-02 DIAGNOSIS — G4700 Insomnia, unspecified: Secondary | ICD-10-CM | POA: Diagnosis not present

## 2022-11-02 DIAGNOSIS — F331 Major depressive disorder, recurrent, moderate: Secondary | ICD-10-CM | POA: Diagnosis not present

## 2022-11-04 DIAGNOSIS — N1831 Chronic kidney disease, stage 3a: Secondary | ICD-10-CM | POA: Diagnosis not present

## 2022-11-14 DIAGNOSIS — R4189 Other symptoms and signs involving cognitive functions and awareness: Secondary | ICD-10-CM | POA: Diagnosis not present

## 2022-11-15 DIAGNOSIS — E441 Mild protein-calorie malnutrition: Secondary | ICD-10-CM | POA: Diagnosis not present

## 2022-11-15 DIAGNOSIS — N1831 Chronic kidney disease, stage 3a: Secondary | ICD-10-CM | POA: Diagnosis not present

## 2022-11-15 DIAGNOSIS — F339 Major depressive disorder, recurrent, unspecified: Secondary | ICD-10-CM | POA: Diagnosis not present

## 2022-11-15 DIAGNOSIS — R4189 Other symptoms and signs involving cognitive functions and awareness: Secondary | ICD-10-CM | POA: Diagnosis not present

## 2022-11-16 DIAGNOSIS — I10 Essential (primary) hypertension: Secondary | ICD-10-CM | POA: Diagnosis not present

## 2022-11-16 DIAGNOSIS — G3184 Mild cognitive impairment, so stated: Secondary | ICD-10-CM | POA: Diagnosis not present

## 2022-11-17 DIAGNOSIS — F0150 Vascular dementia without behavioral disturbance: Secondary | ICD-10-CM | POA: Diagnosis not present

## 2022-11-30 DIAGNOSIS — F01B3 Vascular dementia, moderate, with mood disturbance: Secondary | ICD-10-CM | POA: Diagnosis not present

## 2022-11-30 DIAGNOSIS — G4700 Insomnia, unspecified: Secondary | ICD-10-CM | POA: Diagnosis not present

## 2022-12-12 DIAGNOSIS — R609 Edema, unspecified: Secondary | ICD-10-CM | POA: Diagnosis not present

## 2022-12-13 DIAGNOSIS — L84 Corns and callosities: Secondary | ICD-10-CM | POA: Diagnosis not present

## 2022-12-13 DIAGNOSIS — I739 Peripheral vascular disease, unspecified: Secondary | ICD-10-CM | POA: Diagnosis not present

## 2022-12-13 DIAGNOSIS — L602 Onychogryphosis: Secondary | ICD-10-CM | POA: Diagnosis not present

## 2022-12-13 DIAGNOSIS — L603 Nail dystrophy: Secondary | ICD-10-CM | POA: Diagnosis not present

## 2022-12-22 DIAGNOSIS — F339 Major depressive disorder, recurrent, unspecified: Secondary | ICD-10-CM | POA: Diagnosis not present

## 2022-12-22 DIAGNOSIS — F0150 Vascular dementia without behavioral disturbance: Secondary | ICD-10-CM | POA: Diagnosis not present

## 2022-12-22 DIAGNOSIS — R609 Edema, unspecified: Secondary | ICD-10-CM | POA: Diagnosis not present

## 2022-12-22 DIAGNOSIS — E441 Mild protein-calorie malnutrition: Secondary | ICD-10-CM | POA: Diagnosis not present

## 2023-01-01 DIAGNOSIS — M546 Pain in thoracic spine: Secondary | ICD-10-CM | POA: Diagnosis not present

## 2023-01-01 DIAGNOSIS — M25551 Pain in right hip: Secondary | ICD-10-CM | POA: Diagnosis not present

## 2023-01-01 DIAGNOSIS — M545 Low back pain, unspecified: Secondary | ICD-10-CM | POA: Diagnosis not present

## 2023-01-01 DIAGNOSIS — R102 Pelvic and perineal pain: Secondary | ICD-10-CM | POA: Diagnosis not present

## 2023-01-04 DIAGNOSIS — F01B3 Vascular dementia, moderate, with mood disturbance: Secondary | ICD-10-CM | POA: Diagnosis not present

## 2023-01-04 DIAGNOSIS — G4700 Insomnia, unspecified: Secondary | ICD-10-CM | POA: Diagnosis not present

## 2023-01-31 DIAGNOSIS — H6123 Impacted cerumen, bilateral: Secondary | ICD-10-CM | POA: Diagnosis not present

## 2023-01-31 DIAGNOSIS — F339 Major depressive disorder, recurrent, unspecified: Secondary | ICD-10-CM | POA: Diagnosis not present

## 2023-01-31 DIAGNOSIS — L989 Disorder of the skin and subcutaneous tissue, unspecified: Secondary | ICD-10-CM | POA: Diagnosis not present

## 2023-01-31 DIAGNOSIS — F0150 Vascular dementia without behavioral disturbance: Secondary | ICD-10-CM | POA: Diagnosis not present

## 2023-02-02 DIAGNOSIS — H109 Unspecified conjunctivitis: Secondary | ICD-10-CM | POA: Diagnosis not present

## 2023-02-05 DIAGNOSIS — L03213 Periorbital cellulitis: Secondary | ICD-10-CM | POA: Diagnosis not present

## 2023-02-06 DIAGNOSIS — L0201 Cutaneous abscess of face: Secondary | ICD-10-CM | POA: Diagnosis not present

## 2023-02-07 DIAGNOSIS — L089 Local infection of the skin and subcutaneous tissue, unspecified: Secondary | ICD-10-CM | POA: Diagnosis not present

## 2023-03-01 DIAGNOSIS — F331 Major depressive disorder, recurrent, moderate: Secondary | ICD-10-CM | POA: Diagnosis not present

## 2023-03-01 DIAGNOSIS — G4700 Insomnia, unspecified: Secondary | ICD-10-CM | POA: Diagnosis not present

## 2023-03-01 DIAGNOSIS — F01B3 Vascular dementia, moderate, with mood disturbance: Secondary | ICD-10-CM | POA: Diagnosis not present

## 2023-03-08 DIAGNOSIS — F331 Major depressive disorder, recurrent, moderate: Secondary | ICD-10-CM | POA: Diagnosis not present

## 2023-03-12 DIAGNOSIS — M79641 Pain in right hand: Secondary | ICD-10-CM | POA: Diagnosis not present

## 2023-03-12 DIAGNOSIS — R600 Localized edema: Secondary | ICD-10-CM | POA: Diagnosis not present

## 2023-03-13 DIAGNOSIS — I10 Essential (primary) hypertension: Secondary | ICD-10-CM | POA: Diagnosis not present

## 2023-03-14 DIAGNOSIS — M79641 Pain in right hand: Secondary | ICD-10-CM | POA: Diagnosis not present

## 2023-03-21 DIAGNOSIS — F0150 Vascular dementia without behavioral disturbance: Secondary | ICD-10-CM | POA: Diagnosis not present

## 2023-03-21 DIAGNOSIS — M79641 Pain in right hand: Secondary | ICD-10-CM | POA: Diagnosis not present

## 2023-03-21 DIAGNOSIS — R600 Localized edema: Secondary | ICD-10-CM | POA: Diagnosis not present

## 2023-03-21 DIAGNOSIS — I10 Essential (primary) hypertension: Secondary | ICD-10-CM | POA: Diagnosis not present

## 2023-03-22 DIAGNOSIS — M109 Gout, unspecified: Secondary | ICD-10-CM | POA: Diagnosis not present

## 2023-03-29 DIAGNOSIS — G4700 Insomnia, unspecified: Secondary | ICD-10-CM | POA: Diagnosis not present

## 2023-03-29 DIAGNOSIS — F331 Major depressive disorder, recurrent, moderate: Secondary | ICD-10-CM | POA: Diagnosis not present

## 2023-03-29 DIAGNOSIS — F01B18 Vascular dementia, moderate, with other behavioral disturbance: Secondary | ICD-10-CM | POA: Diagnosis not present

## 2023-04-13 DIAGNOSIS — F339 Major depressive disorder, recurrent, unspecified: Secondary | ICD-10-CM | POA: Diagnosis not present

## 2023-04-13 DIAGNOSIS — R4181 Age-related cognitive decline: Secondary | ICD-10-CM | POA: Diagnosis not present

## 2023-04-25 DIAGNOSIS — H903 Sensorineural hearing loss, bilateral: Secondary | ICD-10-CM | POA: Diagnosis not present

## 2023-04-25 DIAGNOSIS — F0150 Vascular dementia without behavioral disturbance: Secondary | ICD-10-CM | POA: Diagnosis not present

## 2023-04-25 DIAGNOSIS — E441 Mild protein-calorie malnutrition: Secondary | ICD-10-CM | POA: Diagnosis not present

## 2023-04-25 DIAGNOSIS — N1831 Chronic kidney disease, stage 3a: Secondary | ICD-10-CM | POA: Diagnosis not present

## 2023-04-25 DIAGNOSIS — F339 Major depressive disorder, recurrent, unspecified: Secondary | ICD-10-CM | POA: Diagnosis not present

## 2023-04-26 DIAGNOSIS — F331 Major depressive disorder, recurrent, moderate: Secondary | ICD-10-CM | POA: Diagnosis not present

## 2023-04-26 DIAGNOSIS — F01B18 Vascular dementia, moderate, with other behavioral disturbance: Secondary | ICD-10-CM | POA: Diagnosis not present

## 2023-04-26 DIAGNOSIS — G4700 Insomnia, unspecified: Secondary | ICD-10-CM | POA: Diagnosis not present

## 2023-04-26 DIAGNOSIS — Z961 Presence of intraocular lens: Secondary | ICD-10-CM | POA: Diagnosis not present

## 2023-04-26 DIAGNOSIS — H04123 Dry eye syndrome of bilateral lacrimal glands: Secondary | ICD-10-CM | POA: Diagnosis not present

## 2023-05-24 DIAGNOSIS — G4700 Insomnia, unspecified: Secondary | ICD-10-CM | POA: Diagnosis not present

## 2023-05-24 DIAGNOSIS — F331 Major depressive disorder, recurrent, moderate: Secondary | ICD-10-CM | POA: Diagnosis not present

## 2023-05-24 DIAGNOSIS — F01B3 Vascular dementia, moderate, with mood disturbance: Secondary | ICD-10-CM | POA: Diagnosis not present

## 2023-05-31 DIAGNOSIS — F0150 Vascular dementia without behavioral disturbance: Secondary | ICD-10-CM | POA: Diagnosis not present

## 2023-05-31 DIAGNOSIS — N1831 Chronic kidney disease, stage 3a: Secondary | ICD-10-CM | POA: Diagnosis not present

## 2023-05-31 DIAGNOSIS — I10 Essential (primary) hypertension: Secondary | ICD-10-CM | POA: Diagnosis not present

## 2023-05-31 DIAGNOSIS — F339 Major depressive disorder, recurrent, unspecified: Secondary | ICD-10-CM | POA: Diagnosis not present

## 2023-06-04 DIAGNOSIS — L602 Onychogryphosis: Secondary | ICD-10-CM | POA: Diagnosis not present

## 2023-06-04 DIAGNOSIS — L603 Nail dystrophy: Secondary | ICD-10-CM | POA: Diagnosis not present

## 2023-06-04 DIAGNOSIS — I739 Peripheral vascular disease, unspecified: Secondary | ICD-10-CM | POA: Diagnosis not present

## 2023-06-04 DIAGNOSIS — L84 Corns and callosities: Secondary | ICD-10-CM | POA: Diagnosis not present

## 2023-06-05 DIAGNOSIS — R4181 Age-related cognitive decline: Secondary | ICD-10-CM | POA: Diagnosis not present

## 2023-06-05 DIAGNOSIS — F339 Major depressive disorder, recurrent, unspecified: Secondary | ICD-10-CM | POA: Diagnosis not present

## 2023-06-07 DIAGNOSIS — G4700 Insomnia, unspecified: Secondary | ICD-10-CM | POA: Diagnosis not present

## 2023-06-07 DIAGNOSIS — F01B18 Vascular dementia, moderate, with other behavioral disturbance: Secondary | ICD-10-CM | POA: Diagnosis not present

## 2023-06-21 DIAGNOSIS — G4700 Insomnia, unspecified: Secondary | ICD-10-CM | POA: Diagnosis not present

## 2023-06-21 DIAGNOSIS — F01B0 Vascular dementia, moderate, without behavioral disturbance, psychotic disturbance, mood disturbance, and anxiety: Secondary | ICD-10-CM | POA: Diagnosis not present

## 2023-06-21 DIAGNOSIS — I69998 Other sequelae following unspecified cerebrovascular disease: Secondary | ICD-10-CM | POA: Diagnosis not present

## 2023-07-02 DIAGNOSIS — M1990 Unspecified osteoarthritis, unspecified site: Secondary | ICD-10-CM | POA: Diagnosis not present

## 2023-07-09 DIAGNOSIS — F339 Major depressive disorder, recurrent, unspecified: Secondary | ICD-10-CM | POA: Diagnosis not present

## 2023-07-09 DIAGNOSIS — F0150 Vascular dementia without behavioral disturbance: Secondary | ICD-10-CM | POA: Diagnosis not present

## 2023-07-09 DIAGNOSIS — D649 Anemia, unspecified: Secondary | ICD-10-CM | POA: Diagnosis not present

## 2023-07-09 DIAGNOSIS — I10 Essential (primary) hypertension: Secondary | ICD-10-CM | POA: Diagnosis not present

## 2023-07-19 DIAGNOSIS — G4700 Insomnia, unspecified: Secondary | ICD-10-CM | POA: Diagnosis not present

## 2023-07-19 DIAGNOSIS — I69998 Other sequelae following unspecified cerebrovascular disease: Secondary | ICD-10-CM | POA: Diagnosis not present

## 2023-07-19 DIAGNOSIS — F01B0 Vascular dementia, moderate, without behavioral disturbance, psychotic disturbance, mood disturbance, and anxiety: Secondary | ICD-10-CM | POA: Diagnosis not present

## 2023-08-15 DIAGNOSIS — I739 Peripheral vascular disease, unspecified: Secondary | ICD-10-CM | POA: Diagnosis not present

## 2023-08-15 DIAGNOSIS — L602 Onychogryphosis: Secondary | ICD-10-CM | POA: Diagnosis not present

## 2023-08-15 DIAGNOSIS — L603 Nail dystrophy: Secondary | ICD-10-CM | POA: Diagnosis not present

## 2023-08-15 DIAGNOSIS — L84 Corns and callosities: Secondary | ICD-10-CM | POA: Diagnosis not present

## 2023-08-16 DIAGNOSIS — G4700 Insomnia, unspecified: Secondary | ICD-10-CM | POA: Diagnosis not present

## 2023-08-16 DIAGNOSIS — I69998 Other sequelae following unspecified cerebrovascular disease: Secondary | ICD-10-CM | POA: Diagnosis not present

## 2023-08-16 DIAGNOSIS — F01B0 Vascular dementia, moderate, without behavioral disturbance, psychotic disturbance, mood disturbance, and anxiety: Secondary | ICD-10-CM | POA: Diagnosis not present

## 2023-08-21 DIAGNOSIS — N1831 Chronic kidney disease, stage 3a: Secondary | ICD-10-CM | POA: Diagnosis not present

## 2023-08-21 DIAGNOSIS — M818 Other osteoporosis without current pathological fracture: Secondary | ICD-10-CM | POA: Diagnosis not present

## 2023-08-21 DIAGNOSIS — F0150 Vascular dementia without behavioral disturbance: Secondary | ICD-10-CM | POA: Diagnosis not present

## 2023-08-21 DIAGNOSIS — F339 Major depressive disorder, recurrent, unspecified: Secondary | ICD-10-CM | POA: Diagnosis not present

## 2023-08-30 DIAGNOSIS — I69998 Other sequelae following unspecified cerebrovascular disease: Secondary | ICD-10-CM | POA: Diagnosis not present

## 2023-08-30 DIAGNOSIS — G4700 Insomnia, unspecified: Secondary | ICD-10-CM | POA: Diagnosis not present

## 2023-08-30 DIAGNOSIS — F0152 Vascular dementia, unspecified severity, with psychotic disturbance: Secondary | ICD-10-CM | POA: Diagnosis not present

## 2023-09-12 DIAGNOSIS — I10 Essential (primary) hypertension: Secondary | ICD-10-CM | POA: Diagnosis not present

## 2023-09-12 DIAGNOSIS — F39 Unspecified mood [affective] disorder: Secondary | ICD-10-CM | POA: Diagnosis not present

## 2023-09-13 DIAGNOSIS — F0152 Vascular dementia, unspecified severity, with psychotic disturbance: Secondary | ICD-10-CM | POA: Diagnosis not present

## 2023-09-13 DIAGNOSIS — G4700 Insomnia, unspecified: Secondary | ICD-10-CM | POA: Diagnosis not present

## 2023-09-13 DIAGNOSIS — I69998 Other sequelae following unspecified cerebrovascular disease: Secondary | ICD-10-CM | POA: Diagnosis not present

## 2023-09-27 DIAGNOSIS — N1831 Chronic kidney disease, stage 3a: Secondary | ICD-10-CM | POA: Diagnosis not present

## 2023-09-27 DIAGNOSIS — F0150 Vascular dementia without behavioral disturbance: Secondary | ICD-10-CM | POA: Diagnosis not present

## 2023-09-27 DIAGNOSIS — M818 Other osteoporosis without current pathological fracture: Secondary | ICD-10-CM | POA: Diagnosis not present

## 2023-09-27 DIAGNOSIS — F339 Major depressive disorder, recurrent, unspecified: Secondary | ICD-10-CM | POA: Diagnosis not present

## 2023-10-10 DIAGNOSIS — I10 Essential (primary) hypertension: Secondary | ICD-10-CM | POA: Diagnosis not present

## 2023-10-10 DIAGNOSIS — F39 Unspecified mood [affective] disorder: Secondary | ICD-10-CM | POA: Diagnosis not present

## 2023-10-11 DIAGNOSIS — I69998 Other sequelae following unspecified cerebrovascular disease: Secondary | ICD-10-CM | POA: Diagnosis not present

## 2023-10-11 DIAGNOSIS — G4700 Insomnia, unspecified: Secondary | ICD-10-CM | POA: Diagnosis not present

## 2023-10-11 DIAGNOSIS — F01C0 Vascular dementia, severe, without behavioral disturbance, psychotic disturbance, mood disturbance, and anxiety: Secondary | ICD-10-CM | POA: Diagnosis not present

## 2023-10-11 DIAGNOSIS — F39 Unspecified mood [affective] disorder: Secondary | ICD-10-CM | POA: Diagnosis not present

## 2023-10-16 DIAGNOSIS — L603 Nail dystrophy: Secondary | ICD-10-CM | POA: Diagnosis not present

## 2023-10-16 DIAGNOSIS — I739 Peripheral vascular disease, unspecified: Secondary | ICD-10-CM | POA: Diagnosis not present

## 2023-10-16 DIAGNOSIS — L84 Corns and callosities: Secondary | ICD-10-CM | POA: Diagnosis not present

## 2023-10-16 DIAGNOSIS — L602 Onychogryphosis: Secondary | ICD-10-CM | POA: Diagnosis not present

## 2023-10-29 DIAGNOSIS — R011 Cardiac murmur, unspecified: Secondary | ICD-10-CM | POA: Diagnosis not present

## 2023-10-29 DIAGNOSIS — I10 Essential (primary) hypertension: Secondary | ICD-10-CM | POA: Diagnosis not present

## 2023-10-29 DIAGNOSIS — F0150 Vascular dementia without behavioral disturbance: Secondary | ICD-10-CM | POA: Diagnosis not present

## 2023-10-29 DIAGNOSIS — N1831 Chronic kidney disease, stage 3a: Secondary | ICD-10-CM | POA: Diagnosis not present

## 2023-11-08 DIAGNOSIS — I69998 Other sequelae following unspecified cerebrovascular disease: Secondary | ICD-10-CM | POA: Diagnosis not present

## 2023-11-08 DIAGNOSIS — F01C0 Vascular dementia, severe, without behavioral disturbance, psychotic disturbance, mood disturbance, and anxiety: Secondary | ICD-10-CM | POA: Diagnosis not present

## 2023-11-08 DIAGNOSIS — G4700 Insomnia, unspecified: Secondary | ICD-10-CM | POA: Diagnosis not present

## 2023-11-29 DIAGNOSIS — I69998 Other sequelae following unspecified cerebrovascular disease: Secondary | ICD-10-CM | POA: Diagnosis not present

## 2023-11-29 DIAGNOSIS — G4700 Insomnia, unspecified: Secondary | ICD-10-CM | POA: Diagnosis not present

## 2023-11-29 DIAGNOSIS — F01C0 Vascular dementia, severe, without behavioral disturbance, psychotic disturbance, mood disturbance, and anxiety: Secondary | ICD-10-CM | POA: Diagnosis not present

## 2023-11-29 DIAGNOSIS — F411 Generalized anxiety disorder: Secondary | ICD-10-CM | POA: Diagnosis not present

## 2023-12-06 DIAGNOSIS — E441 Mild protein-calorie malnutrition: Secondary | ICD-10-CM | POA: Diagnosis not present

## 2023-12-06 DIAGNOSIS — F0150 Vascular dementia without behavioral disturbance: Secondary | ICD-10-CM | POA: Diagnosis not present

## 2023-12-06 DIAGNOSIS — F339 Major depressive disorder, recurrent, unspecified: Secondary | ICD-10-CM | POA: Diagnosis not present

## 2023-12-06 DIAGNOSIS — M818 Other osteoporosis without current pathological fracture: Secondary | ICD-10-CM | POA: Diagnosis not present
